# Patient Record
Sex: Female | Born: 1950 | Race: White | Hispanic: No | Marital: Single | Smoking: Never smoker
Health system: Southern US, Community
[De-identification: ages and names within clinical notes are randomized; demographics above are authoritative.]

## PROBLEM LIST (undated history)

## (undated) DIAGNOSIS — Z780 Asymptomatic menopausal state: Secondary | ICD-10-CM

## (undated) DIAGNOSIS — R7303 Prediabetes: Principal | ICD-10-CM

## (undated) DIAGNOSIS — Z1231 Encounter for screening mammogram for malignant neoplasm of breast: Secondary | ICD-10-CM

## (undated) DIAGNOSIS — I10 Essential (primary) hypertension: Secondary | ICD-10-CM

## (undated) DIAGNOSIS — H6521 Chronic serous otitis media, right ear: Principal | ICD-10-CM

## (undated) DIAGNOSIS — Z0000 Encounter for general adult medical examination without abnormal findings: Secondary | ICD-10-CM

## (undated) DIAGNOSIS — M25562 Pain in left knee: Secondary | ICD-10-CM

## (undated) DIAGNOSIS — 1 ERRONEOUS ENCOUNTER ICD10: Secondary | ICD-10-CM

## (undated) DIAGNOSIS — G8929 Other chronic pain: Secondary | ICD-10-CM

## (undated) DIAGNOSIS — M7989 Other specified soft tissue disorders: Secondary | ICD-10-CM

## (undated) DIAGNOSIS — K8020 Calculus of gallbladder without cholecystitis without obstruction: Secondary | ICD-10-CM

## (undated) DIAGNOSIS — Z860100 Personal history of colon polyps, unspecified: Principal | ICD-10-CM

## (undated) DIAGNOSIS — Z1211 Encounter for screening for malignant neoplasm of colon: Secondary | ICD-10-CM

## (undated) DIAGNOSIS — M8580 Other specified disorders of bone density and structure, unspecified site: Secondary | ICD-10-CM

## (undated) DIAGNOSIS — U071 COVID-19: Secondary | ICD-10-CM

## (undated) DIAGNOSIS — R42 Dizziness and giddiness: Principal | ICD-10-CM

## (undated) DIAGNOSIS — K8010 Calculus of gallbladder with chronic cholecystitis without obstruction: Secondary | ICD-10-CM

## (undated) DIAGNOSIS — M810 Age-related osteoporosis without current pathological fracture: Secondary | ICD-10-CM

## (undated) DIAGNOSIS — I4510 Unspecified right bundle-branch block: Secondary | ICD-10-CM

## (undated) HISTORY — PX: OTHER SURGICAL HISTORY: SHX169

---

## 1997-11-21 ENCOUNTER — Other Ambulatory Visit: Admission: RE | Admit: 1997-11-21 | Discharge: 1997-11-21 | Payer: Self-pay | Admitting: Obstetrics and Gynecology

## 1998-12-02 ENCOUNTER — Other Ambulatory Visit: Admission: RE | Admit: 1998-12-02 | Discharge: 1998-12-02 | Payer: Self-pay | Admitting: Obstetrics and Gynecology

## 1999-04-08 ENCOUNTER — Other Ambulatory Visit: Admission: RE | Admit: 1999-04-08 | Discharge: 1999-04-08 | Payer: Self-pay | Admitting: Obstetrics and Gynecology

## 1999-05-14 ENCOUNTER — Encounter (INDEPENDENT_AMBULATORY_CARE_PROVIDER_SITE_OTHER): Payer: Self-pay

## 1999-05-14 ENCOUNTER — Other Ambulatory Visit: Admission: RE | Admit: 1999-05-14 | Discharge: 1999-05-14 | Payer: Self-pay | Admitting: Obstetrics and Gynecology

## 1999-06-09 ENCOUNTER — Other Ambulatory Visit: Admission: RE | Admit: 1999-06-09 | Discharge: 1999-06-09 | Payer: Self-pay | Admitting: Obstetrics and Gynecology

## 1999-06-09 ENCOUNTER — Encounter (INDEPENDENT_AMBULATORY_CARE_PROVIDER_SITE_OTHER): Payer: Self-pay | Admitting: Specialist

## 1999-09-28 ENCOUNTER — Other Ambulatory Visit: Admission: RE | Admit: 1999-09-28 | Discharge: 1999-09-28 | Payer: Self-pay | Admitting: Obstetrics and Gynecology

## 2002-01-15 ENCOUNTER — Other Ambulatory Visit: Admission: RE | Admit: 2002-01-15 | Discharge: 2002-01-15 | Payer: Self-pay | Admitting: Obstetrics and Gynecology

## 2003-02-25 ENCOUNTER — Other Ambulatory Visit: Admission: RE | Admit: 2003-02-25 | Discharge: 2003-02-25 | Payer: Self-pay | Admitting: Obstetrics and Gynecology

## 2004-03-03 ENCOUNTER — Other Ambulatory Visit: Admission: RE | Admit: 2004-03-03 | Discharge: 2004-03-03 | Payer: Self-pay | Admitting: Obstetrics and Gynecology

## 2004-07-08 ENCOUNTER — Ambulatory Visit: Payer: Self-pay | Admitting: Internal Medicine

## 2004-08-07 ENCOUNTER — Ambulatory Visit: Payer: Self-pay | Admitting: Family Medicine

## 2004-10-07 ENCOUNTER — Ambulatory Visit: Payer: Self-pay | Admitting: Family Medicine

## 2004-12-15 ENCOUNTER — Ambulatory Visit: Payer: Self-pay | Admitting: Family Medicine

## 2004-12-30 ENCOUNTER — Ambulatory Visit: Payer: Self-pay | Admitting: Family Medicine

## 2005-03-04 ENCOUNTER — Ambulatory Visit: Payer: Self-pay | Admitting: Family Medicine

## 2005-03-16 ENCOUNTER — Other Ambulatory Visit: Admission: RE | Admit: 2005-03-16 | Discharge: 2005-03-16 | Payer: Self-pay | Admitting: Obstetrics and Gynecology

## 2005-04-06 ENCOUNTER — Ambulatory Visit: Payer: Self-pay | Admitting: Family Medicine

## 2005-04-30 ENCOUNTER — Ambulatory Visit: Payer: Self-pay | Admitting: Family Medicine

## 2005-05-24 ENCOUNTER — Ambulatory Visit: Payer: Self-pay | Admitting: Family Medicine

## 2005-07-09 ENCOUNTER — Ambulatory Visit: Payer: Self-pay | Admitting: Family Medicine

## 2005-07-09 ENCOUNTER — Encounter: Admission: RE | Admit: 2005-07-09 | Discharge: 2005-07-09 | Payer: Self-pay | Admitting: Family Medicine

## 2005-07-19 ENCOUNTER — Ambulatory Visit: Payer: Self-pay | Admitting: Family Medicine

## 2005-10-29 ENCOUNTER — Ambulatory Visit: Payer: Self-pay | Admitting: Family Medicine

## 2005-12-13 ENCOUNTER — Ambulatory Visit: Payer: Self-pay | Admitting: Family Medicine

## 2006-02-17 ENCOUNTER — Ambulatory Visit: Payer: Self-pay | Admitting: Family Medicine

## 2006-05-09 ENCOUNTER — Ambulatory Visit: Payer: Self-pay | Admitting: Family Medicine

## 2006-05-13 ENCOUNTER — Ambulatory Visit: Payer: Self-pay | Admitting: Family Medicine

## 2006-05-13 LAB — CONVERTED CEMR LAB
AST: 28 units/L (ref 0–37)
BUN: 16 mg/dL (ref 6–23)
CO2: 28 meq/L (ref 19–32)
Calcium: 9.7 mg/dL (ref 8.4–10.5)
Chloride: 106 meq/L (ref 96–112)
Creatinine, Ser: 0.8 mg/dL (ref 0.4–1.2)
GFR calc non Af Amer: 79 mL/min
Sodium: 141 meq/L (ref 135–145)
Total Bilirubin: 0.6 mg/dL (ref 0.3–1.2)

## 2006-06-13 ENCOUNTER — Ambulatory Visit: Payer: Self-pay | Admitting: Family Medicine

## 2006-07-08 ENCOUNTER — Ambulatory Visit: Payer: Self-pay | Admitting: Internal Medicine

## 2006-09-12 ENCOUNTER — Ambulatory Visit: Payer: Self-pay | Admitting: Family Medicine

## 2006-09-12 LAB — CONVERTED CEMR LAB
Albumin: 3.4 g/dL — ABNORMAL LOW (ref 3.5–5.2)
CO2: 33 meq/L — ABNORMAL HIGH (ref 19–32)
Chloride: 104 meq/L (ref 96–112)
Creatinine, Ser: 0.6 mg/dL (ref 0.4–1.2)
GFR calc non Af Amer: 111 mL/min
Glucose, Bld: 121 mg/dL — ABNORMAL HIGH (ref 70–99)

## 2006-10-17 ENCOUNTER — Ambulatory Visit: Payer: Self-pay | Admitting: Internal Medicine

## 2006-11-03 ENCOUNTER — Emergency Department (HOSPITAL_COMMUNITY): Admission: EM | Admit: 2006-11-03 | Discharge: 2006-11-03 | Payer: Self-pay | Admitting: Emergency Medicine

## 2007-10-23 ENCOUNTER — Ambulatory Visit: Payer: Self-pay | Admitting: Internal Medicine

## 2007-11-06 ENCOUNTER — Ambulatory Visit: Payer: Self-pay | Admitting: Internal Medicine

## 2007-12-18 ENCOUNTER — Telehealth: Payer: Self-pay | Admitting: Internal Medicine

## 2008-01-09 DIAGNOSIS — K5730 Diverticulosis of large intestine without perforation or abscess without bleeding: Secondary | ICD-10-CM | POA: Insufficient documentation

## 2008-01-09 DIAGNOSIS — I10 Essential (primary) hypertension: Secondary | ICD-10-CM | POA: Insufficient documentation

## 2008-01-09 DIAGNOSIS — E785 Hyperlipidemia, unspecified: Secondary | ICD-10-CM

## 2008-01-09 DIAGNOSIS — Z8669 Personal history of other diseases of the nervous system and sense organs: Secondary | ICD-10-CM

## 2008-01-10 ENCOUNTER — Ambulatory Visit: Payer: Self-pay | Admitting: Internal Medicine

## 2008-01-10 DIAGNOSIS — K5732 Diverticulitis of large intestine without perforation or abscess without bleeding: Secondary | ICD-10-CM | POA: Insufficient documentation

## 2012-07-31 ENCOUNTER — Other Ambulatory Visit: Payer: Self-pay | Admitting: Obstetrics and Gynecology

## 2012-08-02 ENCOUNTER — Encounter (HOSPITAL_COMMUNITY): Payer: Self-pay

## 2012-08-02 ENCOUNTER — Encounter (HOSPITAL_COMMUNITY)
Admission: RE | Admit: 2012-08-02 | Discharge: 2012-08-02 | Disposition: A | Discharge status: Home / Self Care | Payer: 59 | Source: Ambulatory Visit | Attending: Obstetrics and Gynecology | Admitting: Obstetrics and Gynecology

## 2012-08-02 HISTORY — DX: Essential (primary) hypertension: I10

## 2012-08-02 LAB — BASIC METABOLIC PANEL
BUN: 22 mg/dL (ref 6–23)
CO2: 30 mEq/L (ref 19–32)
Chloride: 101 mEq/L (ref 96–112)
Sodium: 139 mEq/L (ref 135–145)

## 2012-08-02 LAB — CBC
HCT: 44.5 % (ref 36.0–46.0)
Hemoglobin: 15.1 g/dL — ABNORMAL HIGH (ref 12.0–15.0)
MCH: 30.8 pg (ref 26.0–34.0)
MCHC: 33.9 g/dL (ref 30.0–36.0)
MCV: 90.6 fL (ref 78.0–100.0)
Platelets: 237 10*3/uL (ref 150–400)
RBC: 4.91 MIL/uL (ref 3.87–5.11)

## 2012-08-02 NOTE — Patient Instructions (Addendum)
20 Laurie Cross  08/02/2012   Your procedure is scheduled on:  08/10/12  Enter through the Main Entrance of [facility] at 6 AM.  Pick up the phone at the desk and dial [phone].   Call this number if you have problems the morning of surgery: [phone]   Remember:   Do not eat food:After Midnight.  Do not drink clear liquids: After Midnight.  Take these medicines the morning of surgery with A SIP OF WATER: blood pressure and Citalopram    Do not wear jewelry, make-up or nail polish.  Do not wear lotions, powders, or perfumes. You may wear deodorant.  Do not shave 48 hours prior to surgery.  Do not bring valuables to the hospital.  Contacts, dentures or bridgework may not be worn into surgery.  Leave suitcase in the car. After surgery it may be brought to your room.  For patients admitted to the hospital, checkout time is 11:00 AM the day of discharge.   Patients discharged the day of surgery will not be allowed to drive home.  Name and phone number of your driver: undecided  Special Instructions: Shower using CHG 2 nights before surgery and the night before surgery.  If you shower the day of surgery use CHG.  Use special wash - you have one bottle of CHG for all showers.  You should use approximately 1/3 of the bottle for each shower.   Please read over the following fact sheets that you were given: Surgical Site Infection Prevention

## 2012-08-10 ENCOUNTER — Ambulatory Visit (HOSPITAL_COMMUNITY): Payer: 59 | Admitting: Anesthesiology

## 2012-08-10 ENCOUNTER — Encounter (HOSPITAL_COMMUNITY)
Admission: RE | Disposition: A | Discharge status: Home / Self Care | Payer: Self-pay | Source: Ambulatory Visit | Attending: Obstetrics and Gynecology

## 2012-08-10 ENCOUNTER — Encounter (HOSPITAL_COMMUNITY): Payer: Self-pay | Admitting: Anesthesiology

## 2012-08-10 ENCOUNTER — Encounter (HOSPITAL_COMMUNITY): Payer: Self-pay

## 2012-08-10 ENCOUNTER — Ambulatory Visit (HOSPITAL_COMMUNITY)
Admission: RE | Admit: 2012-08-10 | Discharge: 2012-08-10 | Disposition: A | Discharge status: Home / Self Care | Payer: 59 | Source: Ambulatory Visit | Attending: Obstetrics and Gynecology | Admitting: Obstetrics and Gynecology

## 2012-08-10 DIAGNOSIS — N950 Postmenopausal bleeding: Secondary | ICD-10-CM | POA: Insufficient documentation

## 2012-08-10 DIAGNOSIS — N840 Polyp of corpus uteri: Secondary | ICD-10-CM | POA: Insufficient documentation

## 2012-08-10 DIAGNOSIS — Z01818 Encounter for other preprocedural examination: Secondary | ICD-10-CM | POA: Insufficient documentation

## 2012-08-10 DIAGNOSIS — Z01812 Encounter for preprocedural laboratory examination: Secondary | ICD-10-CM | POA: Insufficient documentation

## 2012-08-10 HISTORY — DX: Unspecified right bundle-branch block: I45.10

## 2012-08-10 HISTORY — PX: HYSTEROSCOPY W/D&C: SHX1775

## 2012-08-10 SURGERY — DILATATION AND CURETTAGE /HYSTEROSCOPY
Anesthesia: General | Site: Uterus | Wound class: Clean Contaminated

## 2012-08-10 MED ORDER — LIDOCAINE HCL (CARDIAC) 20 MG/ML IV SOLN
INTRAVENOUS | Status: DC | PRN
  Administered 2012-08-10 (×2): 30 mg via INTRAVENOUS

## 2012-08-10 MED ORDER — LIDOCAINE HCL 1 % IJ SOLN
INTRAMUSCULAR | Status: DC | PRN
  Administered 2012-08-10: 20 mL

## 2012-08-10 MED ORDER — PROMETHAZINE HCL 25 MG/ML IJ SOLN: 6.2500 mg | INTRAMUSCULAR | Status: DC | PRN

## 2012-08-10 MED ORDER — ONDANSETRON HCL 4 MG/2ML IJ SOLN
INTRAMUSCULAR | Status: DC | PRN
  Administered 2012-08-10: 4 mg via INTRAVENOUS

## 2012-08-10 MED ORDER — PROPOFOL 10 MG/ML IV EMUL
INTRAVENOUS | Status: AC
  Filled 2012-08-10: qty 20

## 2012-08-10 MED ORDER — GLYCINE 1.5 % IR SOLN
Status: DC | PRN
  Administered 2012-08-10: 3000 mL

## 2012-08-10 MED ORDER — FENTANYL CITRATE 0.05 MG/ML IJ SOLN
INTRAMUSCULAR | Status: AC
  Filled 2012-08-10: qty 5

## 2012-08-10 MED ORDER — FENTANYL CITRATE 0.05 MG/ML IJ SOLN: 25.0000 ug | INTRAMUSCULAR | Status: DC | PRN

## 2012-08-10 MED ORDER — PROPOFOL 10 MG/ML IV EMUL
INTRAVENOUS | Status: DC | PRN
  Administered 2012-08-10: 170 mg via INTRAVENOUS

## 2012-08-10 MED ORDER — FENTANYL CITRATE 0.05 MG/ML IJ SOLN
INTRAMUSCULAR | Status: DC | PRN
  Administered 2012-08-10 (×2): 50 ug via INTRAVENOUS

## 2012-08-10 MED ORDER — EPHEDRINE 5 MG/ML INJ
INTRAVENOUS | Status: AC
  Filled 2012-08-10: qty 10

## 2012-08-10 MED ORDER — CEFAZOLIN SODIUM-DEXTROSE 2-3 GM-% IV SOLR
INTRAVENOUS | Status: AC
  Filled 2012-08-10: qty 50

## 2012-08-10 MED ORDER — EPHEDRINE SULFATE 50 MG/ML IJ SOLN
INTRAMUSCULAR | Status: DC | PRN
  Administered 2012-08-10: 20 mg via INTRAVENOUS

## 2012-08-10 MED ORDER — MIDAZOLAM HCL 2 MG/2ML IJ SOLN
INTRAMUSCULAR | Status: AC
  Filled 2012-08-10: qty 2

## 2012-08-10 MED ORDER — KETOROLAC TROMETHAMINE 30 MG/ML IJ SOLN
INTRAMUSCULAR | Status: DC | PRN
  Administered 2012-08-10: 30 mg via INTRAVENOUS

## 2012-08-10 MED ORDER — KETOROLAC TROMETHAMINE 30 MG/ML IJ SOLN
INTRAMUSCULAR | Status: AC
  Filled 2012-08-10: qty 1

## 2012-08-10 MED ORDER — CEFAZOLIN SODIUM-DEXTROSE 2-3 GM-% IV SOLR
2.0000 g | INTRAVENOUS | Status: AC
  Administered 2012-08-10: 2 g via INTRAVENOUS

## 2012-08-10 MED ORDER — MEPERIDINE HCL 25 MG/ML IJ SOLN: 6.2500 mg | INTRAMUSCULAR | Status: DC | PRN

## 2012-08-10 MED ORDER — MIDAZOLAM HCL 5 MG/5ML IJ SOLN
INTRAMUSCULAR | Status: DC | PRN
  Administered 2012-08-10: 2 mg via INTRAVENOUS

## 2012-08-10 MED ORDER — LIDOCAINE HCL 0.5 % IJ SOLN
INTRAMUSCULAR | Status: AC
  Filled 2012-08-10: qty 1

## 2012-08-10 MED ORDER — ONDANSETRON HCL 4 MG/2ML IJ SOLN
INTRAMUSCULAR | Status: AC
  Filled 2012-08-10: qty 2

## 2012-08-10 MED ORDER — LIDOCAINE HCL (CARDIAC) 20 MG/ML IV SOLN
INTRAVENOUS | Status: AC
  Filled 2012-08-10: qty 5

## 2012-08-10 MED ORDER — FENTANYL CITRATE 0.05 MG/ML IJ SOLN
INTRAMUSCULAR | Status: AC
  Filled 2012-08-10: qty 2

## 2012-08-10 MED ORDER — MIDAZOLAM HCL 2 MG/2ML IJ SOLN: 0.5000 mg | Freq: Once | INTRAMUSCULAR | Status: DC | PRN

## 2012-08-10 MED ORDER — LACTATED RINGERS IV SOLN
INTRAVENOUS | Status: DC
  Administered 2012-08-10: 50 mL/h via INTRAVENOUS

## 2012-08-10 MED ORDER — KETOROLAC TROMETHAMINE 30 MG/ML IJ SOLN: 15.0000 mg | Freq: Once | INTRAMUSCULAR | Status: DC | PRN

## 2012-08-10 SURGICAL SUPPLY — 15 items
CANISTER SUCTION 2500CC (MISCELLANEOUS) ×2 IMPLANT
CATH ROBINSON RED A/P 16FR (CATHETERS) ×2 IMPLANT
CLOTH BEACON ORANGE TIMEOUT ST (SAFETY) ×2 IMPLANT
CONTAINER PREFILL 10% NBF 60ML (FORM) ×4 IMPLANT
DRESSING TELFA 8X3 (GAUZE/BANDAGES/DRESSINGS) ×2 IMPLANT
ELECT REM PT RETURN 9FT ADLT (ELECTROSURGICAL) ×2
ELECTRODE REM PT RTRN 9FT ADLT (ELECTROSURGICAL) ×1 IMPLANT
GLOVE ECLIPSE 7.0 STRL STRAW (GLOVE) ×4 IMPLANT
GOWN PREVENTION PLUS XLARGE (GOWN DISPOSABLE) ×2 IMPLANT
GOWN STRL REIN XL XLG (GOWN DISPOSABLE) ×4 IMPLANT
LOOP ANGLED CUTTING 22FR (CUTTING LOOP) IMPLANT
PACK HYSTEROSCOPY LF (CUSTOM PROCEDURE TRAY) ×2 IMPLANT
PAD OB MATERNITY 4.3X12.25 (PERSONAL CARE ITEMS) ×2 IMPLANT
TOWEL OR 17X24 6PK STRL BLUE (TOWEL DISPOSABLE) ×4 IMPLANT
WATER STERILE IRR 1000ML POUR (IV SOLUTION) ×2 IMPLANT

## 2012-08-10 NOTE — Transfer of Care (Signed)
Immediate Anesthesia Transfer of Care Note  Patient: Laurie Cross  Procedure(s) Performed: Procedure(s) (LRB) with comments: DILATATION AND CURETTAGE /HYSTEROSCOPY (N/A)  Patient Location: PACU  Anesthesia Type:General  Level of Consciousness: awake, alert  and oriented  Airway & Oxygen Therapy: Patient Spontanous Breathing and Patient connected to nasal cannula oxygen  Post-op Assessment: Report given to PACU RN and Post -op Vital signs reviewed and stable  Post vital signs: Reviewed and stable  Complications: No apparent anesthesia complications

## 2012-08-10 NOTE — H&P (Signed)
Pt is a 62 year old white female who presents to the OR for a D&C secondary to postmenopausal bleeding. Pt had a normal endometrial biopsy, but her endometrial strip appears thickened. PE: overwt white female in NAD        HEENT- wnl        Abd-soft, non tender, no masses.        Pelvic-wnl IMP/ Post menopausal bleeding Plan/ Hysteroscopy, D&C

## 2012-08-10 NOTE — Anesthesia Preprocedure Evaluation (Addendum)
Anesthesia Evaluation  Patient identified by MRN, date of birth, ID band Patient awake    Reviewed: Allergy & Precautions, H&P , Patient's Chart, lab work & pertinent test results, reviewed documented beta blocker date and time   History of Anesthesia Complications Negative for: history of anesthetic complications  Airway Mallampati: III TM Distance: >3 FB Neck ROM: full  Mouth opening: Limited Mouth Opening  Dental No notable dental hx.    Pulmonary neg pulmonary ROS,  breath sounds clear to auscultation  Pulmonary exam normal       Cardiovascular Exercise Tolerance: Good hypertension, negative cardio ROS  + dysrhythmias Rhythm:regular Rate:Normal  RBBB   Neuro/Psych Depression negative neurological ROS     GI/Hepatic negative GI ROS, Neg liver ROS,   Endo/Other  negative endocrine ROSMorbid obesity  Renal/GU negative Renal ROS     Musculoskeletal   Abdominal   Peds  Hematology negative hematology ROS (+)   Anesthesia Other Findings RBBB  Reproductive/Obstetrics negative OB ROS                         Anesthesia Physical Anesthesia Plan  ASA: III  Anesthesia Plan: General LMA   Post-op Pain Management:    Induction:   Airway Management Planned:   Additional Equipment:   Intra-op Plan:   Post-operative Plan:   Informed Consent: I have reviewed the patients History and Physical, chart, labs and discussed the procedure including the risks, benefits and alternatives for the proposed anesthesia with the patient or authorized representative who has indicated his/her understanding and acceptance.   Dental Advisory Given  Plan Discussed with: CRNA, Surgeon and Anesthesiologist  Anesthesia Plan Comments:         Anesthesia Quick Evaluation

## 2012-08-10 NOTE — Anesthesia Postprocedure Evaluation (Signed)
  Anesthesia Post-op Note  Patient: Laurie Cross  Procedure(s) Performed: Procedure(s) (LRB) with comments: DILATATION AND CURETTAGE /HYSTEROSCOPY (N/A)  Patient Location: PACU  Anesthesia Type:General  Level of Consciousness: awake, alert  and oriented  Airway and Oxygen Therapy: Patient Spontanous Breathing  Post-op Pain: none  Post-op Assessment: Post-op Vital signs reviewed, Patient's Cardiovascular Status Stable, Respiratory Function Stable, Patent Airway, No signs of Nausea or vomiting and Pain level controlled  Post-op Vital Signs: Reviewed and stable  Complications: No apparent anesthesia complications

## 2012-08-10 NOTE — Op Note (Signed)
NAME:  NAVI, EWTON NO.:  [id number]  MEDICAL RECORD NO.:  [id number]  LOCATION:  [facility]                          FACILITY:  [facility]  PHYSICIAN:  Malva Limes, M.D.    DATE OF BIRTH:  1951-05-05  DATE OF PROCEDURE:  08/10/2012 DATE OF DISCHARGE:                              OPERATIVE REPORT   PREOPERATIVE DIAGNOSIS:  Postmenopausal bleeding.  POSTOPERATIVE DIAGNOSES: 1. Postmenopausal bleeding. 2. Endometrial polyp.  PROCEDURES: 1. Hysteroscopy. 2. Resection of endometrial polyp. 3. Dilation and curettage.  SURGEON:  Malva Limes, MD  ANESTHESIA:  General with local.  ANTIBIOTICS:  Ancef 2 g.  DRAINS:  Red rubber catheter bladder.  SPECIMENS:  Endometrial polyp and endometrial curettings sent to Pathology.  ESTIMATED BLOOD LOSS:  Minimal.  COMPLICATIONS:  None.  PROCEDURE:  The patient was taken to the operating room.  She was placed in a dorsal supine position and general anesthetic was administered without difficulty.  She was then placed in dorsal supine position.  She was prepped and draped in the usual fashion for this procedure.  Her bladder was drained with a red rubber catheter.  An exam under anesthesia revealed anteverted uterus of normal size and shape.  There are no adnexal masses.  A sterile speculum was placed in the vagina.  A 20 mL of 1% lidocaine was used for paracervical block.  A single-tooth tenaculum was applied to the anterior cervical lip.  The cervix was serially dilated to a 21-French.  The hysteroscope was advanced through the endocervical canal which appeared to be normal.  On entering the uterine cavity, both ostia were visualized and felt to be normal. Majority of the endometrial lining was atrophic.  There was an endometrial polyp which looked infarcted, arising from the fundus of the uterus on a long stalk.  At this point, the stalk was resected from the base at the fundus and removed, this was sent to pathology.  A  sharp curettage was then performed on the endometrial cavity and endocervical cavity.  Minimal tissue was obtained.  The uterus had been sounded to 7 cm.  This concluded the procedure.  The patient was taken to the recovery room in stable condition.  Instrument and lap counts correct x2.          ______________________________ Malva Limes, M.D.     MA/MEDQ  D:  08/10/2012  T:  08/10/2012  Job:  [id number]

## 2012-08-11 ENCOUNTER — Encounter (HOSPITAL_COMMUNITY): Payer: Self-pay | Admitting: Obstetrics and Gynecology

## 2012-08-19 ENCOUNTER — Other Ambulatory Visit: Payer: Self-pay

## 2013-05-10 ENCOUNTER — Other Ambulatory Visit: Payer: Self-pay

## 2013-05-22 ENCOUNTER — Other Ambulatory Visit: Payer: Self-pay | Admitting: Obstetrics and Gynecology

## 2014-04-19 ENCOUNTER — Other Ambulatory Visit: Payer: Self-pay

## 2014-05-23 ENCOUNTER — Other Ambulatory Visit: Payer: Self-pay | Admitting: Obstetrics and Gynecology

## 2014-05-27 LAB — CYTOLOGY - PAP

## 2014-11-29 LAB — AMB EXT HEP C ANTIBODY
HEP C ANTIBODY, EXTERNAL: NONREACTIVE
Hep C Antibody, External: NONREACTIVE

## 2015-05-27 ENCOUNTER — Other Ambulatory Visit: Payer: Self-pay | Admitting: Obstetrics and Gynecology

## 2015-05-28 ENCOUNTER — Encounter: Payer: Self-pay | Admitting: Internal Medicine

## 2015-05-30 LAB — CYTOLOGY - PAP

## 2016-06-03 ENCOUNTER — Other Ambulatory Visit: Payer: Self-pay | Admitting: Obstetrics and Gynecology

## 2016-06-04 LAB — CYTOLOGY - PAP

## 2017-05-04 ENCOUNTER — Encounter

## 2017-06-07 ENCOUNTER — Encounter

## 2017-06-07 ENCOUNTER — Inpatient Hospital Stay: Admit: 2017-06-07 | Payer: MEDICARE | Attending: Speech-Language Pathologist | Primary: Family Medicine

## 2017-06-07 DIAGNOSIS — Z1231 Encounter for screening mammogram for malignant neoplasm of breast: Secondary | ICD-10-CM

## 2017-06-07 DIAGNOSIS — M8580 Other specified disorders of bone density and structure, unspecified site: Secondary | ICD-10-CM

## 2017-11-09 ENCOUNTER — Encounter: Payer: Self-pay | Admitting: Internal Medicine

## 2018-04-05 ENCOUNTER — Encounter

## 2018-06-09 ENCOUNTER — Inpatient Hospital Stay: Admit: 2018-06-09 | Payer: MEDICARE | Attending: Speech-Language Pathologist | Primary: Family Medicine

## 2018-06-09 DIAGNOSIS — Z1231 Encounter for screening mammogram for malignant neoplasm of breast: Secondary | ICD-10-CM

## 2019-01-22 ENCOUNTER — Encounter

## 2019-03-07 LAB — HM COLONOSCOPY

## 2019-06-06 ENCOUNTER — Encounter

## 2019-06-12 ENCOUNTER — Inpatient Hospital Stay: Admit: 2019-06-12 | Payer: MEDICARE | Attending: Obstetrics | Primary: Family Medicine

## 2019-06-12 DIAGNOSIS — M810 Age-related osteoporosis without current pathological fracture: Secondary | ICD-10-CM

## 2019-06-12 DIAGNOSIS — Z1231 Encounter for screening mammogram for malignant neoplasm of breast: Secondary | ICD-10-CM

## 2019-07-13 LAB — CMP, EXTERNAL
ALBUMIN, EXTERNAL: 4.3
ALBUMIN, EXTERNAL: 4.3 NA
ALK PHOS, EXTERNAL: 49
ALK PHOS, EXTERNAL: 49 NA
BILI TOTAL, EXTERNAL: 1
BUN, EXTERNAL: 14
BUN, EXTERNAL: 14 NA
CO2, EXTERNAL: 27
CO2, External: 27 NA
CREATININE SER, EXTERNAL: 0.85
CREATININE SER, EXTERNAL: 0.85 NA
Calcium, EXTERNAL: 8.9
Calcium, EXTERNAL: 8.9 NA
Chloride, EXTERNAL: 102
Chloride, EXTERNAL: 102 NA
Glucose SER, EXTERNAL: 104
Glucose, EXTERNAL: 104 NA
Potassium, EXTERNAL: 3.4
Potassium, External: 3.4 NA
Protein TOT, EXTERNAL: 7.2
Protein, Total, EXTERNAL: 7.2 NA
SGOT (AST), EXTERNAL: 20
SGOT (AST), EXTERNAL: 20 NA
SGPT (ALT), EXTERNAL: 25
SGPT (ALT), EXTERNAL: 25 NA
Sodium, EXTERNAL: 140
Sodium, External: 140 NA
Total Bilirubin, EXTERNAL: 1 NA

## 2019-07-13 LAB — LIPID PANEL
HDL, EXTERNAL: 49 NA
HDL, External: 49
LDL-C, External: 135
LDL-C, External: 135 NA
TOTAL CHOLESTEROL, EXTERNAL: 221 NA
TRIGLYCERIDES, EXTERNAL: 184 NA
Total Cholesterol, External: 221
Triglycerides, External: 184

## 2019-11-08 NOTE — Telephone Encounter (Signed)
Future Appointments   Date Time Provider Department Center   02/01/2020  1:45 PM Amparo Bristol, MD [facility]           Packet Mailed

## 2019-11-12 NOTE — Telephone Encounter (Signed)
I will do a separate phone note once chart has been abstracted.

## 2020-01-23 ENCOUNTER — Encounter

## 2020-02-01 ENCOUNTER — Ambulatory Visit: Attending: Family Medicine | Primary: Family Medicine

## 2020-02-01 ENCOUNTER — Ambulatory Visit: Admit: 2020-02-01 | Discharge: 2020-02-01 | Payer: MEDICARE | Attending: Family Medicine | Primary: Family Medicine

## 2020-02-01 DIAGNOSIS — Z0000 Encounter for general adult medical examination without abnormal findings: Secondary | ICD-10-CM

## 2020-02-01 MED ORDER — HYDROCHLOROTHIAZIDE 25 MG TAB
25 mg | ORAL_TABLET | Freq: Every day | ORAL | 3 refills | Status: DC
Start: 2020-02-01 — End: 2020-12-25

## 2020-02-01 NOTE — Progress Notes (Signed)
[facility]   [address] [place] [address]  [phone]    SUBSEQUENT MEDICARE [place] VISIT       CHIEF COMPLAINT     LEVAEH VICE, 69 y.o. female, presents for Annual Wellness Visit and Establish Care.   New pt to myself and [facility] primary care.    HPI     1.  Preventive  Colonoscopy in 2020, normal - 5 year recall  Sees GYN for paps/mams and UTD on them  Recent DEXA with osteopenia  Hx of melanoma, sees Derm regularly    ROS: No TIA's or dysphagia. No prolonged cough. No dyspnea or chest pain on exertion.  No abdominal pain, change in bowel habits, black or bloody stools.  No urinary tract symptoms. She is post menopausal. No hot flashes, abnormal vaginal bleeding, discharge or unexpected pelvic pain. No new breast lumps, breast pain or nipple discharge.      OBJECTIVE     Visit Vitals  BP 138/86 (BP 1 Location: Left arm, BP Patient Position: Sitting, BP Cuff Size: Adult)   Pulse 67   Ht 5' 5.5" (1.664 m)   Wt 185 lb 14.4 oz (84.3 kg)   BMI 30.46 kg/m??   I have reviewed/discussed the above normal BMI with the patient.  I have recommended the following interventions: dietary management education, guidance, and counseling, encourage exercise and monitor weight . Marland Kitchen         BP Readings from Last 3 Encounters:   02/01/20 138/86      Wt Readings from Last 3 Encounters:   02/01/20 185 lb 14.4 oz (84.3 kg)     Physical Exam  Constitutional:       Appearance: Normal appearance. She is obese.   HENT:      Head: Normocephalic and atraumatic.      Right Ear: Tympanic membrane normal.      Left Ear: Tympanic membrane normal.   Eyes:      General:         Right eye: No discharge.         Left eye: No discharge.      Extraocular Movements: Extraocular movements intact.      Conjunctiva/sclera: Conjunctivae normal.      Pupils: Pupils are equal, round, and reactive to light.   Cardiovascular:      Rate and Rhythm: Normal rate and regular rhythm.      Pulses: Normal pulses.       Heart sounds: Normal heart sounds.   Pulmonary:      Effort: Pulmonary effort is normal.      Breath sounds: Normal breath sounds.   Abdominal:      General: Bowel sounds are normal.      Palpations: Abdomen is soft.   Musculoskeletal:      Cervical back: Normal range of motion and neck supple.      Right lower leg: No edema.      Left lower leg: No edema.   Skin:     General: Skin is warm and dry.      Capillary Refill: Capillary refill takes less than 2 seconds.   Neurological:      General: No focal deficit present.      Mental Status: She is alert and oriented to person, place, and time.   Psychiatric:         Mood and Affect: Mood normal.  Behavior: Behavior normal.           ASSESSMENT AND PLAN     Diagnoses and all orders for this visit:    1. Medicare annual wellness visit, subsequent    2. Essential hypertension  -     hydroCHLOROthiazide (HYDRODIURIL) 25 mg tablet; Take 1 Tablet by mouth daily.    3. Preventative health care    1.  Preventive  UTD on recommended cancer screenings and imms.  Lifestyle advice given.  Keep f/u with specialist providers.      WELLNESS EVALUATION & HEALTH RISK ASSESSMENT     DEPRESSION SCREENING  3 most recent [place] Screens 02/01/2020   Little interest or pleasure in doing things Not at all   Feeling down, depressed, irritable, or hopeless Not at all   Total Score PHQ 2 0          FALL RISK SCREENING  Fall Risk Assessment, last 12 mths 02/01/2020   Able to walk? Yes   Fall in past 12 months? 1   Do you feel unsteady? 0   Are you worried about falling 0   Is TUG test greater than 12 seconds? 0   Is the gait abnormal? 0   Number of falls in past 12 months 1   Fall with injury? 1       GENERAL  In general, how would you say your health is?: Excellent  Do you have a Living Will?: Yes    HEALTH HABITS AND NUTRITION  Do you worry whether your food will run out before you have money to buy more?: No  Have you lost any weight without trying in the past 3 months?:  No    HEARING/VISION/SKIN  Do you or your family notice any trouble with your hearing?: No  Do you have difficulty driving, watching TV, or doing any of your daily activities because of your eyesight?: No  Do you have any skin concerns?: No    SLEEP/MEMORY/ANXIETY  Do you have concerns about your sleep?: No  Do you often feel tired, fatigued, or sleepy during the daytime, even after a "good" night's sleep? : No  Do you or a family member have concerns about your memory?: No  Over the last several months, have you been continually worried or anxious about a number of events or activities in your daily life? : No    SUBSTANCE AND OPIOID USE  Do you currently drink alcohol?: No  In the past year, have you used street drugs or prescription medications not prescribed to you?: No  Are you currently using a prescription opioid pain medication such as oxycodone, tramadol, hydrocodone or morphine?: No    SAFETY  Does your home have throw rugs, poor lighting, a slippery bathtub or shower, or clutter in the hallways?: No  Do all of your stairways have a railing or banister?: Yes  Do you have difficulty with balance or walking?: No    ADLS  In the past 7 days, did you need help from others to perform any of the following everyday activities?: None  In the past 7 days, did you need help from others to take care of any of the following?: None    PHYSICAL ACTIVITY  Are You Physically Active: Yes  Do You Have Difficulty Exercising: No  What Type of Activity Do You Engage In: Walking, Other, Mowing/yard work  How Many Days Are You Active Per Week : 5-7  How Many Average Minutes of Activity  Per Day: More than 60 Minutes     TIMED UP AND GO TEST (If indicated)       MINI-COG SCORE (If indicated)       STOP-BANG SCORE (If indicated)         PREVENTIVE CARE -SCREENINGS      Health Maintenance Topics with due status: Overdue       Topic Date Due    Shingrix Vaccine Age 37> Never done     Health Maintenance Topics with due status: Not Due        Topic Last Completion Date    DTaP/Tdap/Td series 05/11/2017    Colorectal Cancer Screening Combo 03/07/2019    Flu Vaccine 04/29/2019    Breast Cancer Screen Mammogram 06/12/2019    Lipid Screen 07/13/2019    Medicare Yearly Exam 02/01/2020     Health Maintenance Topics with due status: Completed       Topic Last Completion Date    Hepatitis C Screening 11/29/2014    Pneumococcal 65+ years 05/18/2018    Bone Densitometry (Dexa) Screening 06/12/2019    COVID-19 Vaccine 10/06/2019         Colon cancer:  Recommendation: Colonoscopy every 10y or annual FIT test from 50-75 or every 3 year stool DNA based test with consideration of ongoing screening from 76-85. and Up to date or Completed    Lung cancer (LDCT): Recommendation: Yearly LDCT for pts 55-77 w 30-pack year hx and currently smoke or quit <15 yr ago. and Not Indicated    Hepatitis C:  Recommendation: One time screening for all patient's aged 18-79. and Up to date or Completed    Diabetes:   Recommendation: USPSTF recommends screening ages 18-70 y/o if overweight or obese. Medicare covers screening in those patients who are overweight, obese, have HTN or dyslipiemia, a personal history of gestational diabetes or prior elevated blood sugar, a family hx of DM, or any patient over age 14 and Up to date or Completed    Lipids:   Recommendation: screening for hyperlipidemia every 5 years after age 60 and Up to date or Completed    PREVENTIVE CARE - FEMALE SCREENINGS     Cervical cancer: Recommendation: Every 3 yr from 21-29 and every 5 yr from 46-65, with Pap and HPV testing. and Up to date or Completed    Breast cancer: Recommendation:  USPSTF recommends screening mammography every 2 yr from 50-74. The decision to start screening mammography in women prior to age 92 years should be an individual one. Women who place a higher value on the potential benefit than the potential harms may choose to begin biennial screening between the ages of 69 and 51 years.  Medicare covers annual screening mammography, USPSTF also recommends women with a personal or family history of breast, ovarian, tubal, or peritoneal cancer or who have an ancestry (Ashkenazi Jewish) associated with breast cancer susceptibility 1 and 2 (BRCA1/2) gene mutations with an appropriate brief familial risk assessment tool. Women with a positive result on the risk assessment tool should receive genetic counseling and, if indicated after counseling, genetic testing and mammogram is up to date or completed    Osteoporosis: Recommendation: Screen all women at 9, earlier if elevated risk and Up to date or Completed    AAA:   Recommendation: One-time screening if family history of AAA and Not Indicated    IMMUNIZATIONS     Immunization History   Administered Date(s) Administered   ??? Covid-19, MODERNA, Mrna, Lnp-s, Pf, 125mg/0.5mL  09/08/2019, 10/06/2019   ??? Influenza Vaccine 04/28/2019   ??? Influenza, Quadrivalent, Adjuvanted (>65 Yrs FLUAD QUAD 42595) 04/29/2019   ??? Pneumococcal Polysaccharide (PPSV-23) 05/18/2018   ??? Tdap 05/11/2017       Pneumovax:   Recommendation: PPSV23 once for all >65 and high risk <65  and Up to date or Completed    Prevnar:   Recommendation: PCV13 only if >65 and immunocompromised or residing in a nursing home, or in areas of low childhood Pneumococcal vaccination and Not Indicated    Influenza:   Recommendation: Vaccination annually, high dose if 65 or older and Up to date or Completed    Shingrix:  Recommendation: Vaccination 2 shots 2-6 months apart for all age >29 and Up to date or Completed    TDaP:    Recommendation: Investment banker, operational with TDaP every 10 yr. and Up to date or Completed      INDIVIDUALIZED SCREENING, EDUCATION, AND PLAN     The patient and any caregiver(s) were counseled on: Healthcare maintenance and preventive items as above.    The patient is not on high risk medication(s) including benzodiaepines.    Based on my evaluation of the patient and the Health Risk  Assessment performed today, there is not evidence of cognitive impairment.      Medications, allergies, problem list, previous encounters, recent results, medical, social and family history reviewed in the electronic health record. Medications and problems are viewable in the Encounter tab for this visit.    Current Outpatient Medications   Medication Sig   ??? losartan (COZAAR) 25 mg tablet    ??? fluticasone propionate (Flonase Allergy Relief) 50 mcg/actuation nasal spray 1 Spray by Both Nostrils route.   ??? antiox #8/om3/dha/epa/lut/zeax (PRESERVISION AREDS 2, OMEGA-3, PO) Take  by mouth.   ??? red yeast rice extract 600 mg cap Take 1,200 mg by mouth daily.   ??? triamcinolone acetonide (KENALOG) 0.1 % topical cream Apply  to affected area as needed for Skin Irritation. use thin layer   ??? cholecalciferol (VITAMIN D3) (2,000 UNITS /50 MCG) cap capsule Take  by mouth daily.   ??? hydroCHLOROthiazide (HYDRODIURIL) 25 mg tablet Take 1 Tablet by mouth daily.     No current facility-administered medications for this visit.     Medications Discontinued During This Encounter   Medication Reason   ??? hydroCHLOROthiazide (HYDRODIURIL) 25 mg tablet REORDER     Allergies   Allergen Reactions   ??? Amoxicillin Unknown (comments)   ??? Clindamycin Unknown (comments)     Past Medical History:   Diagnosis Date   ??? Back pain    ??? Bilateral cataracts    ??? Contusion of left breast    ??? Diverticulitis    ??? Epiretinal membrane     Bilateral   ??? H/O seasonal allergies    ??? Hyperlipidemia    ??? Hypertension    ??? Hypertensive disorder    ??? Hypertensive retinopathy of both eyes    ??? Hypokalemia    ??? Kidney stone    ??? Macular drusen, bilateral    ??? Nuclear senile cataract of both eyes    ??? Osteopenia    ??? Palpitations    ??? Submandibular lymphadenopathy    ??? Vitreous floaters      Past Surgical History:   Procedure Laterality Date   ??? HX COLONOSCOPY  03/07/2019    [facility] - diverticulosis, polyp   ??? HX CYST REMOVAL  07/05/1981   ??? HX  HYSTERECTOMY  1999    prolaped uterus   ??? HX OOPHORECTOMY Bilateral 1999    prolapsed uterus     Family History   Problem Relation Age of Onset   ??? Breast Cancer Maternal Aunt         50's with recurrance   ??? Diabetes Mother    ??? Cataract Mother         Bilateral   ??? Breast Cancer Mother    ??? Diabetes Father    ??? Cataract Father         Bilateral   ??? Macular Degen Father    ??? Prostate Cancer Father    ??? Breast Cancer Sister    ??? Diabetes Other         Other family hx of   ??? Other Other         Grandmother - malignant tumor of colon, Grandfather - Bilateral degeneration of macula     Social History     Socioeconomic History   ??? Marital status: MARRIED     Spouse name: Not on file   ??? Number of children: Not on file   ??? Years of education: Not on file   ??? Highest education level: Not on file   Tobacco Use   ??? Smoking status: Never Smoker   ??? Smokeless tobacco: Never Used   Substance and Sexual Activity   ??? Alcohol use: Not Currently     Alcohol/week: 1.0 standard drinks     Types: 1 Glasses of wine per week   ??? Drug use: Never     Social Determinants of Company secretary Strain:    ??? Difficulty of Paying Living Expenses:    Food Insecurity:    ??? Worried About Charity fundraiser in the Last Year:    ??? Arboriculturist in the Last Year:    Transportation Needs:    ??? Film/video editor (Medical):    ??? Lack of Transportation (Non-Medical):    Physical Activity:    ??? Days of Exercise per Week:    ??? Minutes of Exercise per Session:    Stress:    ??? Feeling of Stress :    Social Connections:    ??? Frequency of Communication with Friends and Family:    ??? Frequency of Social Gatherings with Friends and Family:    ??? Attends Religious Services:    ??? Marine scientist or Organizations:    ??? Attends Archivist Meetings:    ??? Marital Status:           Patient Care Team:  Erven Colla, MD as PCP - General (Family Medicine)    Follow-up and Dispositions    ?? Return in about 1 year (around  01/31/2021) for MWV/PE.       Future Appointments   Date Time Provider [place]   02/02/2021  8:45 AM Erven Colla, MD [facility]         Erven Colla, MD, 02/01/2020  This encounter has been electronically signed

## 2020-02-01 NOTE — Telephone Encounter (Signed)
HIN abstracted. External records from [facility] have been reviewed and abstracted as well. I have called D[facility] Chase to request pathology report from pts colonoscopy done on 03/07/19 be faxed over.

## 2020-06-10 ENCOUNTER — Encounter

## 2020-06-10 MED ORDER — LOSARTAN 25 MG TAB
25 mg | ORAL_TABLET | Freq: Every day | ORAL | 1 refills | Status: DC
Start: 2020-06-10 — End: 2020-12-25

## 2020-06-10 NOTE — Telephone Encounter (Signed)
The pt called and left a message looking for a refill on her losartan. Please advise.

## 2020-06-10 NOTE — Telephone Encounter (Signed)
Call back needed: no  Medication(s): Losartan  Quantity: 90                                         Pharmacy:  [facility]  Prescriber:   Dr. Laurell Roof  Last appt @ PCP Office:  02/01/2020  Future Appointments   Date Time Provider Department Center   02/02/2021  8:45 AM Amparo Bristol, MD [facility]       MOST RECENT BLOOD PRESSURES  BP Readings from Last 3 Encounters:   02/01/20 138/86        MOST RECENT LAB DATA  Lab Results   Component Value Date/Time    CREATININE SER, EXTERNAL 0.85 07/13/2019 12:00 AM    Potassium, EXTERNAL 3.4 07/13/2019 12:00 AM

## 2020-11-17 ENCOUNTER — Telehealth

## 2020-11-17 NOTE — Telephone Encounter (Signed)
Telephone Encounter by Halina Maidens, RN at 11/17/20 1641                Author: Halina Maidens, RN  Service: --  Author Type: Registered Nurse       Filed: 11/17/20 1644  Encounter Date: 11/17/2020  Status: Signed          Editor: Halina Maidens, RN (Registered Nurse)               Please review if the patient is a candidate for Paxlovid and if not let me know if you want care management to send a referral for monoclonal therapy.       Candidates for Paxlovid   GFR>=30   No severe (Childs C cirrhosis) liver disease   Symptom onset 5 days or less   No drug interactions that would preclude therapy (can use Epic to check interactions as well as the [employer] (https://www.covid19-druginteractions.org/checker) and NIH guideline below)         The patient tested positive for COVID   Symptom Onset: 5/13   Today is Day # 3 of symptoms   [x]  Med Rec up to date as of 5/16      High Risk Conditions (checked all that apply)      Clinical risk factors   [x]  Age  ? 51   [x]  BMI  ? 30   []  Cancer   []  Cardiovascular disease   []  Chronic Kidney Disease (CKD)   []  Chronic Lung Disease   []  Diabetes, type 1 or type 2   []  Disabilities   []  Ethnic or racial minority   []  Immunosuppressed due to conditions or medications   []  Liver Disease   []  Pregnancy   []  Sickle Cell Disease   []  Substance Use Disorder   []  Physical Inactivity      Full list can be viewed at   Underlying Medical Conditions Associated  with Higher Risk for Severe COVID-19 (6/16 CDC)      Other Information:   No results found for: NA, K, CL, CO2, AGAP, GLU, BUN, CREA, BUCR, GFRAA, GFRNA, CA, TBIL, TBILI, AP, TP, ALB, GLOB, AGRAT, ALT, AST       The patient does not have a history of liver disease         Current meds:     Current Outpatient Medications        Medication  Sig         ?  losartan (COZAAR) 25 mg tablet  Take 1 Tablet by mouth daily.         ?  fluticasone propionate (Flonase Allergy Relief) 50 mcg/actuation nasal spray  1 Spray by Both  Nostrils route.         ?  antiox #8/om3/dha/epa/lut/zeax (PRESERVISION AREDS 2, OMEGA-3, PO)  Take  by mouth.     ?  red yeast rice extract 600 mg cap  Take 1,200 mg by mouth daily.     ?  triamcinolone acetonide (KENALOG) 0.1 % topical cream  Apply  to affected area as needed for Skin Irritation. use thin layer     ?  cholecalciferol (VITAMIN D3) (2,000 UNITS /50 MCG) cap capsule  Take  by mouth daily.         ?  hydroCHLOROthiazide (HYDRODIURIL) 25 mg tablet  Take 1 Tablet by mouth daily.              Guidelines of common medication interactions with  Paxlovid

## 2020-11-17 NOTE — Telephone Encounter (Signed)
I will send back to Steph to see if the pt is a candidate for the oral medication or MAB treatment.

## 2020-11-17 NOTE — Telephone Encounter (Signed)
The pt teted positive today for covid symptoms started Friday     The pt received the oral medication for her spouse and she would like a call to discuss getting this,   She said that she feels like she id getting better,   She said it is just like allergies,

## 2020-11-18 MED ORDER — NIRMATRELVIR 300 MG (150 MG X2)-RITONAVIR 100 MG TABLET,DOSE PACK(EUA)
300 mg (150 mg x 2)-100 mg | Freq: Two times a day (BID) | ORAL | 0 refills | Status: AC
Start: 2020-11-18 — End: 2020-11-23

## 2020-11-18 NOTE — Telephone Encounter (Signed)
Yes,  OK for paxlovid.  Please verify pharm.

## 2020-11-18 NOTE — Telephone Encounter (Signed)
Rx sent.

## 2020-11-18 NOTE — Telephone Encounter (Signed)
Pt left message waiting for a response to if she can get medication please call her

## 2020-11-18 NOTE — Telephone Encounter (Signed)
Pt would like this sent to [facility].

## 2020-12-25 ENCOUNTER — Encounter

## 2020-12-25 MED ORDER — HYDROCHLOROTHIAZIDE 25 MG TAB
25 mg | ORAL_TABLET | Freq: Every day | ORAL | 1 refills | Status: AC
Start: 2020-12-25 — End: ?

## 2020-12-25 MED ORDER — LOSARTAN 25 MG TAB
25 mg | ORAL_TABLET | Freq: Every day | ORAL | 1 refills | Status: AC
Start: 2020-12-25 — End: ?

## 2020-12-25 NOTE — Telephone Encounter (Signed)
 Medications Requested:  Requested Prescriptions     Pending Prescriptions Disp Refills   . hydroCHLOROthiazide  (HYDRODIURIL ) 25 mg tablet 90 Tablet 3     Sig: Take 1 Tablet by mouth daily.   . losartan  (COZAAR ) 25 mg tablet 90 Tablet 1     Sig: Take 1 Tablet by mouth daily.       Preferred Pharmacy:   [facility] - [place], [place] - [address]  [address]  [place] [address]  Phone: [phone] Fax: [phone]    [facility] [address] [place] - [address]  [address] [place] [address]  Phone: [phone] Fax: [phone]    Prescription Refill Protocol reviewed:  Yes    Medication: Hydrochlorothiazide   Last Office Visit: 1 year  Lab Monitoring: BMP or CMP within 1 year  Category: Ace- Inhibitors + Thiazide Diuretic  Length of refill: 1 year  Brand Name: No  Comments: If last BP >S140 or D90 then REFILL ONLY 1 and send note to provider    Allergy List Reviewed and Verified: Yes    Possible medication to medication interactions reviewed: Yes    Last appt @ PCP Office: 02/01/2020    Future Appointments   Date Time Provider Department Center   02/02/2021  8:45 AM Linward Dene PARAS, MD [facility]       MOST RECENT BLOOD PRESSURES  BP Readings from Last 3 Encounters:   02/01/20 138/86         MOST RECENT LAB DATA  Lab Results   Component Value Date/Time    CREATININE SER, EXTERNAL 0.85 07/13/2019 12:00 AM    Potassium, EXTERNAL 3.4 07/13/2019 12:00 AM

## 2020-12-25 NOTE — Telephone Encounter (Signed)
Katherine Osborne  12/07/1950      Call back needed: NO    Preferred call back number:    [phone] (home)    Telephone Information:   Mobile [phone]        Medications Requested: losartan 25 mg tablet & hydroCHLOROthiazide 25 mg tablet  Requested Prescriptions      No prescriptions requested or ordered in this encounter       Preferred Pharmacy: [facility] [place]   [facility] - [place], [place] - [address]  [address]  [place] [address]  Phone: [phone] Fax: [phone]    [facility] [address], [place] - [address]  [address] [place] [address]  Phone: [phone] Fax: [phone]

## 2020-12-31 ENCOUNTER — Telehealth

## 2020-12-31 NOTE — Telephone Encounter (Signed)
Would you like me to order a Lipid, and CMP?

## 2020-12-31 NOTE — Telephone Encounter (Signed)
The pt called and she has an appt for 02/02/21 she would like the routine labs added and sent to [facility] so they can get the done prior   Please let her know when sent   As a FYI she also wants you to know that she passed another kidney stone she said this is the 2nd one in a month , they do not seem to bother her much but wants to make sure you guys discuss this at the upcoming appt

## 2021-01-01 NOTE — Telephone Encounter (Signed)
Yes, CMP and lipid.

## 2021-01-01 NOTE — Telephone Encounter (Signed)
Patient is aware and labs have been sent to lab

## 2021-01-02 ENCOUNTER — Telehealth

## 2021-01-02 NOTE — Telephone Encounter (Signed)
Telephone Encounter by Mertie Clause, CMA at 01/02/21 1613                Author: Mertie Clause, CMA  Service: --  Author Type: Medical Assistant       Filed: 01/02/21 1624  Encounter Date: 01/02/2021  Status: Signed          Editor: Merrithew, Lynne Leader, CMA (Medical Assistant)               I called the pt back and she stated that she was in the ER today and was diagnosed with gallstones.  She said that they were going to do an [place] but they were so backed  up with outpatients that it was going to be hours.  The PA told her that he wasn't going to let her go home until he got her labs back, but her liver function was good so he felt comfortable sending her home.  Pt would like to know if she needs an US  done and if so can you please advise on ordering.  She was told she could take ibuprofen and they gave her a toradol shot.            Date of Service: 01/02/2021 12:00 PM    CT ABDOMEN/PELVIS W/O CONTRAST (IVP) CPT CODE: 56387 DATE: 01/02/2021  12:28 PM      EXAM: CT ABDOMEN/PELVIS W/O CONTRAST (IVP)    INDICATION: RIGHT FLANK PAIN .    COMPARISON: None  available.    TECHNIQUE: Region of study: Abdomen and pelvis. CT images acquired without intravenous  contrast nor oral contrast.    One or more of these dose optimization techniques were utilized: Automated exposure control;  mA  and/or kV adjustment per patient size (includes targeted exams where dose is matched to  clinical indication); or iterative reconstruction.    FINDINGS: Note that absence of intravenous contrast limits evaluation of solid visceral organs,   and reduces sensitivity for detection of active inflammation, infection, and mass lesions.    LUNG BASES: Clear.    LIVER: Smooth in contour. Diffuse hepatic steatosis.    BILIARY SYSTEM: No gallbladder wall thickening or pericholecystic  fat stranding. Multiple  small gallstones. The gallbladder is mildly distended. No biliary ductal dilatation.      DIAGNOSTIC IMAGING  Page 1  of 3  [facility]  Name: Katherine Osborne, Katherine Osborne  DOB: 07-19-1950  Age: 6 Sex: F X-Ray #: [id number]  Account #: [id number] Patient Type: ER/OPS 020 ED 01  Patient Phone #: [phone] Admit: 01/02/2021 11:39 AM  Med Record #: [id number] Disc:  Accession: [id number]    Unsigned Transcriptions are preliminary  reports and do not represent a medical or legal document.    RADIOLOGY REPORT - FINAL RADIOLOGY    PANCREAS: Unremarkable.    SPLEEN: Unremarkable.    ADRENAL GLANDS: Unremarkable.    KIDNEYS: No contour abnormalities.  No hydroureteronephrosis. No calcified stones. No  pathologic perinephric or periureteral fat stranding to indicate inflammation or a recently passed  stone.    URINARY BLADDER: No focal wall thickening or pericystic fat stranding. No bladder   calculi.    PELVIC ORGANS: Status post hysterectomy and probably oophorectomy.    BOWEL: Limited evaluation without intravenous or oral contrast. Normal caliber. No  obstruction or ileus. No wall thickening or fat stranding. Scattered  colonic diverticula, with no  evidence of acute diverticulitis. Appendix visualized and within normal limits.    PERITONEUM:  No ascites. No extraluminal air.    LYMPH NODES: No lymphadenopathy.    ABDOMINAL AORTA: No aneurysm.  Scattered vascular calcifications.    INFERIOR VENA CAVA: Normal contour.    ABDOMINAL WALL: No hernias.    BONES: No acute fracture or suspicious lesions. 44 mild degenerative changes of the spine.    SOFT TISSUES: Unremarkable.     IMPRESSION: Cholelithiasis. Mildly distended gallbladder, nonspecific. No significant  pericholecystic inflammatory changes. If indicated, consider further evaluation with right upper  quadrant ultrasound.    DIAGNOSTIC IMAGING  Page  2 of 3  [facility]  Name: Katherine Osborne, Katherine Osborne  DOB: [birth year] Age: 70 Sex: F X-Ray #: [id number]  Account #: [id number] Patient Type: ER/OPS 020 ED 01  Patient Phone #: [phone] Admit: 01/02/2021 11:39 AM  Med Record  #:  [id number] Disc:  Accession: [id number]    Unsigned Transcriptions are preliminary reports and do not represent a medical or legal document.    RADIOLOGY REPORT - FINAL RADIOLOGY    Diffuse hepatic steatosis.    Status  post hysterectomy and probably oophorectomy.      Read by: Candie Chroman  Electronically Signed by: Candie Chroman  Sign: 01/02/2021 1:06 PM  Trandate/initial:  Dictation Location: [id number]

## 2021-01-02 NOTE — Telephone Encounter (Signed)
Cassie is this something that you can advise on or do you want me to send to Dr. Laurell Roof to advise on Tuesday?

## 2021-01-02 NOTE — Telephone Encounter (Signed)
The pt left a VM that she does not have kidney stones she has gallstones, she just the ER @ [facility]   She asked for a call on this

## 2021-01-02 NOTE — Telephone Encounter (Signed)
I called patient. Her LFTs were normal. She states feeling much better. No RUQ pain. I recommend her having RUQ ultrasound outpatient for follow up and to avoid fatty and rich foods.   If acute pain, nausea, vomiting or fevers arise to go to ED.

## 2021-01-14 ENCOUNTER — Ambulatory Visit: Primary: Family Medicine

## 2021-01-14 ENCOUNTER — Telehealth

## 2021-01-14 ENCOUNTER — Encounter

## 2021-01-14 DIAGNOSIS — K8020 Calculus of gallbladder without cholecystitis without obstruction: Secondary | ICD-10-CM

## 2021-01-14 NOTE — Telephone Encounter (Signed)
Let her know her ultrasound showed a fatty liver (we will keep an eye on her liver enzymes, and she should work on weight loss) and many gall stones.  I'd recommend she see General Surgery to discuss gallbladder removal.

## 2021-01-15 NOTE — Telephone Encounter (Signed)
Pt is aware.

## 2021-01-15 NOTE — Telephone Encounter (Signed)
Lm for pt to call me back to tell her.

## 2021-01-15 NOTE — Telephone Encounter (Signed)
Please sign referral if appropriate. Please advise what would cause patient to have a Fatty Liver?

## 2021-01-15 NOTE — Telephone Encounter (Signed)
The pt is aware of results and would like the referral but in . Please put in a referral to a general surgery that you would recommend.   The pt asked what would cause a fatty liver she will not be around after 4 PM

## 2021-01-15 NOTE — Telephone Encounter (Signed)
Gen surg referral in. Fatty liver is a relatively common finding. It can be caused by being overweight or high cholesterol.

## 2021-01-22 ENCOUNTER — Ambulatory Visit: Attending: Surgery | Primary: Family Medicine

## 2021-01-22 ENCOUNTER — Ambulatory Visit: Admit: 2021-01-22 | Discharge: 2021-01-22 | Payer: MEDICARE | Attending: Surgery | Primary: Family Medicine

## 2021-01-22 DIAGNOSIS — K8020 Calculus of gallbladder without cholecystitis without obstruction: Secondary | ICD-10-CM

## 2021-01-22 NOTE — H&P (Signed)
 H&P by Dorma Dickey RAMAN, CMA at 01/22/21 1230                Author: Dorma Dickey RAMAN, CMA  Service: --  Author Type: Medical Assistant       Filed: 01/22/21 1310  Encounter Date: 01/22/2021  Status: Signed          Editor: GomezValencia, Raynell SQUIBB, MD (Physician)                  [facility]    [address]   [place] [address]   [phone]        CC     Symptomatic cholelithiasis        HPI     Katherine Osborne is a 70 y.o. female who presents to the practice referred by her PCP: Katherine Mood, MD due to symptomatic cholelithiasis      70yo F w/ PMHx significant for obesity class 1, HTN, heaptic steatosis, hysterectomy      Patient consulted [facility] ER on 01/02/21 due to complaints of severe right flank pain with some nausea. This was also  noted to be worsened with food intake. The pain radiated to the back. Described as sharp and colicky. She thought was passing a kidney stone. A CT A/P showed Cholelithiasis  with a mildly distended gallbladder. No significant   pericholecystic inflammatory changes. Significant images reviewed by me below              The patient had persistent symptoms and thus an outpatient RUQ ultrasound was performed on 01/14/21 ([facility]) showing a gallbladder filled with stones. No pericholecystic fluid  is seen. No intrahepatic or extrahepatic biliary ductal dilation.              The patient tells me this is affecting her quality of life. She has been very cautious about what she eats since this makes the pain return. Has been nauseous but without emesis. She would like to proceed  with surgical intervention next available date      Denies fever or chills. No weight loss. No jaundice. No pale stools or hyperpigmented urine.      Most recent LFTs from 01/13/21 were normal.       She does not take blood thinners or immunosuppressants      Retired Product manager      Lives in [place] with her  husband           Past Medical History:        Diagnosis  Date         ?  Back pain       ?  Bilateral cataracts       ?  Contusion of left breast       ?  Diverticulitis       ?  Epiretinal membrane            Bilateral         ?  H/O seasonal allergies       ?  Hyperlipidemia       ?  Hypertension       ?  Hypertensive disorder       ?  Hypertensive retinopathy of both eyes       ?  Hypokalemia       ?  Kidney stone       ?  Macular drusen, bilateral       ?  Nuclear senile cataract of both eyes       ?  Osteopenia       ?  Palpitations       ?  Submandibular lymphadenopathy           ?  Vitreous floaters            Past Surgical History:         Procedure  Laterality  Date          ?  HX COLONOSCOPY    03/07/2019          [facility] - Colonoscopy diverticulosis, polyp Oneil Rhyme           ?  HX CYST REMOVAL    07/05/1981     ?  HX HYSTERECTOMY    1999          prolaped uterus          ?  HX OOPHORECTOMY  Bilateral  1999          prolapsed uterus          Family History         Problem  Relation  Age of Onset          ?  Breast Cancer  Maternal Aunt                50's with recurrance          ?  Diabetes  Mother       ?  Cataract  Mother                Bilateral          ?  Breast Cancer  Mother                malignant tumor          ?  Diabetes  Father       ?  Cataract  Father                Bilateral          ?  Macular Degen  Father       ?  Prostate Cancer  Father                malignant          ?  Breast Cancer  Sister       ?  Diabetes  Other                Other family hx of          ?  Other  Other                Grandmother - malignant tumor of colon, Grandfather - Bilateral degeneration of macula          Social History          Socioeconomic History         ?  Marital status:  MARRIED              Spouse name:  Not on file         ?  Number of children:  Not on file     ?  Years of education:  Not on file     ?  Highest education level:  Not on file       Occupational History        ?   Not on  file       Tobacco Use         ?  Smoking status:  Never     ?  Smokeless tobacco:  Never       Substance and Sexual Activity         ?  Alcohol use:  Not Currently              Alcohol/week:  1.0 standard drink         Types:  1 Glasses of wine per week         ?  Drug use:  Never     ?  Sexual activity:  Not on file        Other Topics  Concern        ?  Not on file       Social History Narrative        ?  Not on file          Social Determinants of Health          Financial Resource Strain: Not on file     Food Insecurity: Not on file     Transportation Needs: Not on file     Physical Activity: Not on file     Stress: Not on file     Social Connections: Not on file     Intimate Partner Violence: Not on file       Housing Stability: Not on file          Current Outpatient Medications          Medication  Sig  Dispense  Refill           ?  hydroCHLOROthiazide  (HYDRODIURIL ) 25 mg tablet  Take 1 Tablet by mouth daily.  90 Tablet  1     ?  losartan  (COZAAR ) 25 mg tablet  Take 1 Tablet by mouth daily.  90 Tablet  1     ?  fluticasone propionate (Flonase Allergy Relief) 50 mcg/actuation nasal spray  1 Spray by Both Nostrils route.         ?  antiox #8/om3/dha/epa/lut/zeax (PRESERVISION AREDS 2, OMEGA-3, PO)  Take  by mouth.         ?  red yeast rice extract 600 mg cap  Take 1,200 mg by mouth daily.         ?  triamcinolone acetonide (KENALOG) 0.1 % topical cream  Apply  to affected area as needed for Skin Irritation. use thin layer               ?  cholecalciferol (VITAMIN D3) (2,000 UNITS /50 MCG) cap capsule  Take  by mouth daily.              Allergies        Allergen  Reactions         ?  Amoxicillin  Unknown (comments)         ?  Clindamycin  Unknown (comments)              The medications were reviewed and updated in the medical record.   The past medical history, past surgical history, and family history were reviewed and updated in the medical record.        REVIEW OF SYSTEMS     Review of Systems     Constitutional:  Positive for weight loss. Negative for chills, diaphoresis, fever and malaise/fatigue.    HENT: Negative.  Negative for congestion, ear discharge, ear pain, hearing loss, nosebleeds, sinus pain, sore throat and tinnitus.     Eyes: Negative.  Negative for blurred vision, double vision, photophobia, pain, discharge and redness.    Respiratory: Negative.  Negative for cough, hemoptysis, sputum production, shortness of breath, wheezing and stridor.     Cardiovascular: Negative.  Negative for chest pain, palpitations, orthopnea, claudication, leg swelling and PND.    Gastrointestinal:  Positive for nausea. Negative for abdominal pain, blood in stool, constipation, diarrhea, heartburn, melena and vomiting.     Genitourinary: Negative.  Negative for dysuria, flank pain, frequency, hematuria and urgency.    Musculoskeletal: Negative.  Negative for back pain, falls, joint pain, myalgias and neck pain.    Skin: Negative.  Negative for itching and rash.    Neurological: Negative.  Negative for dizziness, tingling, tremors, sensory change, speech change, focal weakness, seizures, loss of consciousness, weakness and headaches.    Endo/Heme/Allergies: Negative.  Negative for environmental allergies and polydipsia. Does  not bruise/bleed easily.    Psychiatric/Behavioral: Negative.  Negative for depression, hallucinations, memory loss,  substance abuse and suicidal ideas. The patient is not nervous/anxious and does not have insomnia.          PHYSICAL EXAM        Visit Vitals      BP  (!) (P) 140/72 (BP 1 Location: Left upper arm, BP Patient Position: Sitting, BP Cuff Size: Large adult)     Pulse  (P) 64     Temp  (P) 97.6 F (36.4 C) (Temporal)     Ht  (P) 5' 5 (1.651 m)     Wt  (P) 177 lb (80.3 kg)     SpO2  (P) 98%        BMI  (P) 29.45 kg/m              Physical Exam   Constitutional :        General: She is not in acute distress.      Appearance: She is not ill-appearing.     Cardiovascular:       Rate and  Rhythm: Normal rate.       Pulses: Normal pulses.       Heart sounds: Normal heart sounds.    Pulmonary:       Effort: Pulmonary effort is normal.       Breath sounds: Normal breath sounds.     Abdominal:       General: There is no distension.       Palpations: There is no mass.       Tenderness: There is no abdominal tenderness.       Hernia: No hernia is present.    Skin:      General: Skin is warm.       Coloration: Skin is not jaundiced.    Neurological:       General: No focal deficit present.       Mental Status: She is alert and oriented to person, place, and time.    Psychiatric:          Osborne and Affect: Osborne normal.          Behavior: Behavior normal.                     DATA     Most Recent Lab Result        Lab Results  Component  Value  Date/Time            Sodium, EXTERNAL  140  07/13/2019 12:00 AM       Potassium, EXTERNAL  3.4  07/13/2019 12:00 AM       Chloride, EXTERNAL  102  07/13/2019 12:00 AM       CO2, EXTERNAL  27  07/13/2019 12:00 AM       Glucose SER, EXTERNAL  104  07/13/2019 12:00 AM       BUN, EXTERNAL  14  07/13/2019 12:00 AM            CREATININE SER, EXTERNAL  0.85  07/13/2019 12:00 AM             Lab Results         Component  Value  Date/Time            Calcium, EXTERNAL  8.9  07/13/2019 12:00 AM       BILI TOTAL, EXTERNAL  1.0  07/13/2019 12:00 AM       SGPT (ALT), EXTERNAL  25  07/13/2019 12:00 AM       SGOT (AST), EXTERNAL  20  07/13/2019 12:00 AM       ALK PHOS, EXTERNAL  49  07/13/2019 12:00 AM       Protein TOT, EXTERNAL  7.2  07/13/2019 12:00 AM            ALBUMIN, EXTERNAL  4.3  07/13/2019 12:00 AM             Lab Results         Component  Value  Date/Time            Hep C Antibody, External  Non-Reactive  11/29/2014 12:00 AM              US  Abdomen limited    [facility]    [address] [place], [place] [address]    [phone]       DIAGNOSTIC IMAGING      Name: GIULIANA, HANDYSIDE   DOB: Mar 24, 1951 Age: 36 Sex: F X-Ray #: [id number]   Account #:  [id number] Patient Type: O/P   Patient Phone #: [phone] Admit: 01/14/2021 8:48 AM   Med Record #: [id number] Disc: 01/14/2021 8:49 AM    Accession: [id number]   Admitting Physician: CINDIE DORMAN DRONE   Family Physician: LINWARD Katherine PARAS Legrand Transcriptions are preliminary reports and do not represent a medical or legal document.      RADIOLOGY REPORT - FINAL RADIOLOGY      Date of Service: 01/14/2021 8:53 AM      US  ABDOMEN LIMITED/MISC CPT CODE: 23294 DATE: 01/14/2021 9:25 AM         EXAM: US  ABDOMEN LIMITED/MISC      INDICATION: RUQ PAIN DETECTION OF GALLSTONES ON AB PELVIC .      COMPARISON: Correlation is made to CT from January 02, 2021.      TECHNIQUE: Region of study: Right upper quadrant abdomen .      Grayscale and color Doppler sonographic images acquired by a medical sonographer.      FINDINGS: LIVER: Echogenic liver parenchyma, suggesting diffuse hepatic steatosis. No   focal mass. Smooth contour of the hepatic capsule. Main portal vein is patent with hepatopetal   flow.      GALLBLADDER: There is a wall-echo-shadow appearance of the gallbladder fossa, suggestive   of a gallbladder filled  with stones. No pericholecystic fluid is seen. Sonographer reports a   negative sonographic Murphy sign.      BILIARY TREE: No intrahepatic or extrahepatic biliary ductal dilation.      CBD: Normal caliber.      RIGHT KIDNEY: No hydronephrosis or calculi. Right kidney measures 10.9 cm in   craniocaudal dimension. No right renal cortical mass is demonstrated.    DIAGNOSTIC IMAGING   Page 1 of 2    [facility]   Name: BRANDACE, CARGLE   DOB: Nov 13, 1950 Age: 2 Sex: F X-Ray #: [id number]   Account #: [id number] Patient Type: O/P   Patient Phone #: [phone] Admit: 01/14/2021 8:48 AM   Med Record #: [id number] Disc: 01/14/2021 8:49 AM    Accession: [id number]      Unsigned Transcriptions are preliminary reports and do not represent a medical or legal document.      RADIOLOGY REPORT - FINAL  RADIOLOGY         PANCREAS: Essentially obscured by bowel gas.      FLUID: No ascites/free fluid.      IMPRESSION: Wall-echo-shadow appearance of the gallbladder fossa, suggestive of a   gallbladder filled with stones. Sonographer indicated and negative sonographic Murphy sign and   there is no compelling evidence of cholecystitis. No biliary dilation.      Hepatic steatosis.      Pancreas is largely obscured by bowel gas, precluding its evaluation.         Read by: Emeline Shivers, MD   Electronically Signed by: Emeline Shivers, MD   Sign: 01/14/2021 10:01 AM   Trandate/initial:   Dictation Location: [id number]                DIAGNOSTIC IMAGING   Page 2 of 2            [employer] Source Data            CT Abdomen WO contrast    [facility]    [address] [place], [place] [address]    [id number]       DIAGNOSTIC IMAGING      Name: KEASIA, DUBOSE   DOB: August 08, 1950 Age: 24 Sex: F X-Ray #: [id number]   Account #: [id number] Patient Type: ER/OPS 020 ED 01   Patient Phone #: [phone] Admit: 01/02/2021 11:39 AM   Med Record #: [id number] Disc:    Accession: [id number]   Admitting Physician: OCONNOR, SHAUN PA   Family Physician: LINWARD Katherine PARAS Legrand Transcriptions are preliminary reports and do not represent a medical or legal document.      RADIOLOGY REPORT - FINAL RADIOLOGY      Date of Service: 01/02/2021 12:00 PM      CT ABDOMEN/PELVIS W/O CONTRAST (IVP) CPT CODE: 25823 DATE: 01/02/2021   12:28 PM         EXAM: CT ABDOMEN/PELVIS W/O CONTRAST (IVP)      INDICATION: RIGHT FLANK PAIN .      COMPARISON: None available.      TECHNIQUE: Region of study: Abdomen and pelvis. CT images acquired without intravenous   contrast nor oral contrast.      One or more of these dose optimization techniques were utilized: Automated exposure control;   mA and/or kV adjustment per patient size (includes targeted exams where dose is matched to   clinical indication); or iterative reconstruction.      FINDINGS:  Note that absence of  intravenous contrast limits evaluation of solid visceral organs,   and reduces sensitivity for detection of active inflammation, infection, and mass lesions.      LUNG BASES: Clear.      LIVER: Smooth in contour. Diffuse hepatic steatosis.      BILIARY SYSTEM: No gallbladder wall thickening or pericholecystic fat stranding. Multiple   small gallstones. The gallbladder is mildly distended. No biliary ductal dilatation.          DIAGNOSTIC IMAGING   Page 1 of 3    [facility]   Name: VENNA, BERBERICH   DOB: 24-Sep-1950 Age: 58 Sex: F X-Ray #: [id number]   Account #: [id number] Patient Type: ER/OPS 020 ED 01   Patient Phone #: [phone] Admit: 01/02/2021 11:39 AM   Med Record #: [id number] Disc:    Accession: [id number]      Unsigned Transcriptions are preliminary reports and do not represent a medical or legal document.      RADIOLOGY REPORT - FINAL RADIOLOGY      PANCREAS: Unremarkable.      SPLEEN: Unremarkable.      ADRENAL GLANDS: Unremarkable.      KIDNEYS: No contour abnormalities. No hydroureteronephrosis. No calcified stones. No   pathologic perinephric or periureteral fat stranding to indicate inflammation or a recently passed   stone.      URINARY BLADDER: No focal wall thickening or pericystic fat stranding. No bladder   calculi.      PELVIC ORGANS: Status post hysterectomy and probably oophorectomy.      BOWEL: Limited evaluation without intravenous or oral contrast. Normal caliber. No   obstruction or ileus. No wall thickening or fat stranding. Scattered colonic diverticula, with no   evidence of acute diverticulitis. Appendix visualized and within normal limits.      PERITONEUM: No ascites. No extraluminal air.      LYMPH NODES: No lymphadenopathy.      ABDOMINAL AORTA: No aneurysm. Scattered vascular calcifications.      INFERIOR VENA CAVA: Normal contour.      ABDOMINAL WALL: No hernias.      BONES: No acute fracture or suspicious lesions. 44 mild degenerative changes  of the spine.      SOFT TISSUES: Unremarkable.      IMPRESSION: Cholelithiasis. Mildly distended gallbladder, nonspecific. No significant   pericholecystic inflammatory changes. If indicated, consider further evaluation with right upper   quadrant ultrasound.       DIAGNOSTIC IMAGING   Page 2 of 3    [facility]   Name: NYELLE, WOLFSON   DOB: 05-Aug-1950 Age: 29 Sex: F X-Ray #: [id number]   Account #: [id number] Patient Type: ER/OPS 020 ED 01   Patient Phone #: [phone] Admit: 01/02/2021 11:39 AM   Med Record #: [id number] Disc:    Accession: [id number]      Unsigned Transcriptions are preliminary reports and do not represent a medical or legal document.      RADIOLOGY REPORT - FINAL RADIOLOGY      Diffuse hepatic steatosis.      Status post hysterectomy and probably oophorectomy.         Read by: Aldona Coca   Electronically Signed by: Aldona Coca   Sign: 01/02/2021 1:06 PM   Trandate/initial:   Dictation Location: [id number]                DIAGNOSTIC IMAGING   Page 3 of 3  [employer] Source Data                      IMPRESSION        70yo F w/ PMHx significant for obesity class 1, HTN, heaptic steatosis, hysterectomy      The patient presents with symptomatic cholelithiasis with recurrent biliary attack. She has evidence of gallbladder disease both on her CT scan and RUQ ultrasound. Her most recent LFTs do not show an obstructive pattern but she has significant stone burden.  I have discussed with the patient my recommendations for lap cholecystectomy with IOC.      I discussed with patient risks and benefits of laparoscopic cholecystectomy + IOC. Risks including, but not limited to bleeding, infection, bile leak, injury to bile ducts, bowel, liver, blood vessels, nerves and surrounding organs. Need to convert to  open operation, DVT, PE, death among others. Patient expressed understanding and wishes to proceed with planned operation.            RECOMMENDATIONS        Informed consent  was signed   Risks / benefits were discussed with the patient   Surgical date: 01/29/21   Alarm signs were given   All questions answered         A written copy of this report with its recommendations has been sent to the requesting provider            Raynell SHAUNNA Loffler, MD   01/22/2021

## 2021-01-22 NOTE — Telephone Encounter (Signed)
I called the pt and she is currently on her way to the surgeon to discuss getting her gallbladder removed.  She is scheduled to see [place] on the 1st and would like to discuss medication at her visit.

## 2021-01-22 NOTE — Telephone Encounter (Signed)
Let her know I got her labs from [facility].  Her cholesterol is significantly worse than last check.  I do think we should start her on a low dose statin.

## 2021-01-23 NOTE — Anesthesia Pre-Procedure Evaluation (Signed)
Relevant Problems   CARDIOVASCULAR   (+) Essential hypertension      GASTROINTESTINAL   (+) Fatty liver       Anesthetic History               Review of Systems / Medical History  Patient summary reviewed and pertinent labs reviewed    Pulmonary  Within defined limits                 Neuro/Psych   Within defined limits           Cardiovascular    Hypertension              Exercise tolerance: >4 METS  Comments: Katherine Osborne works in her garden almost every day, about hour per day.   GI/Hepatic/Renal           PUD and liver disease    Comments: Fatty liver Endo/Other  Within defined limits           Other Findings            Physical Exam    Airway  Mallampati: II  TM Distance: > 6 cm  Neck ROM: normal range of motion   Mouth opening: Normal     Cardiovascular    Rhythm: regular  Rate: normal         Dental  No notable dental hx       Pulmonary  Breath sounds clear to auscultation               Abdominal         Other Findings            Anesthetic Plan    ASA: 2  Anesthesia type: general          Induction: Intravenous  Anesthetic plan and risks discussed with: Patient      NPO status, allergies and medication history reviewed.

## 2021-01-26 NOTE — Interval H&P Note (Signed)
A Pre-Admission Testing Assessment phone call was completed and the following instructions were reviewed with the patient and verbalized understanding of all instructions.     The following preoperative instructions were reviewed:     On the day of your surgery, please arrive to the [place] entrance of [facility], located at [address] in [place].  Valet parking is available at no cost.  You will be directed to the Surgical Center on the third floor.    Date of Surgery/Procedure: 7/28   Arrival Time:   Surgery/Procedure Time:     You must have an adult with you to drive you home.  You must also have a responsible person to stay with you after surgery and during the night.  You should not drive a car for 24 hours following your surgery.    Do not eat after midnight on the night before surgery. Clears no dairy up until 2 hrs before sx then nothing until after sx    Please follow your cardiologist instructions for aspirin.  If you are currently taking Plavix, Coumadin, or other blood-thinning agents, contact your surgeon for instructions.    Please wear clean, comfortable loose fitting clothes.      You mush shower/wash your entire body from head to toe before arriving for your surgery.  Do not apply deodorants, lotions, makeup, perfumes after you wash on the day of surgery.    Remove fingernail and toenail polish.    Remove all jewelry (including wedding rings) on the day of surgery.  Remove all body piercings..    Please wear your hair loose or down, no pony-tails or buns, and no bobby pins or clips.      Do not shave near the surgical site.    If you are being admitted, we encourage you to bring personal hygiene items.  You may bring laptop computers, tablets, cell phones, books, and maybe a favorite pillow for your comfort. [facility] is not responsible for loss of any personal items.    If you use a CPAP machine, please bring only the mask with you.  It can be attached to the hospital machines.   Please be ready to let the staff know your CPAP settings.    Please bring a current medication list.    It is very important that you be on time. If a situation occurs where you may be delayed or late, please call: [phone] on the day of surgery.    If you have any questions, concerns, or problems, please call [phone] to speak with someone in Pre-Admission Testing.    Special Instructions:      Medications to stop taking NOW:    Medications to take the morning of surgery with a sip of water:   Fluticasone if needed, pt told to stop preservision and red yeast rice now      Pre-Op Instructions provided by: Eulas Post, RN  01/26/2021    1:01 PM

## 2021-01-28 MED FILL — CEFAZOLIN 10 GRAM SOLUTION FOR INJECTION: 10 gram | INTRAMUSCULAR | Qty: 2000

## 2021-01-29 ENCOUNTER — Ambulatory Visit: Admit: 2021-01-29 | Payer: MEDICARE | Primary: Family Medicine

## 2021-01-29 ENCOUNTER — Inpatient Hospital Stay: Payer: MEDICARE

## 2021-01-29 LAB — POC COVID-19
COVID-19 POC: NEGATIVE
Kit Lot No.: 181302

## 2021-01-29 LAB — AMB POC COVID-19 COV
COVID-19 POC: NEGATIVE
KIT LOT NO.: 181302

## 2021-01-29 MED ORDER — GLYCOPYRROLATE 0.2 MG/ML IJ SOLN
0.2 mg/mL | INTRAMUSCULAR | Status: DC | PRN
Start: 2021-01-29 — End: 2021-01-29
  Administered 2021-01-29: 11:00:00 via INTRAVENOUS

## 2021-01-29 MED ORDER — SODIUM CHLORIDE 0.9 % IV
10 gram | Freq: Once | INTRAVENOUS | Status: AC
Start: 2021-01-29 — End: 2021-01-29
  Administered 2021-01-29: 11:00:00 via INTRAVENOUS

## 2021-01-29 MED ORDER — DEXAMETHASONE SODIUM PHOSPHATE (PF) 10 MG/ML INJECTION
10 mg/mL | INTRAMUSCULAR | Status: AC
Start: 2021-01-29 — End: ?

## 2021-01-29 MED ORDER — GLYCOPYRROLATE(PF) 0.4 MG/2 ML (0.2 MG/ML) IN STERILE WATER IV SYRINGE
0.4 mg/2 mL (0.2 mg/mL) | INTRAVENOUS | Status: AC
Start: 2021-01-29 — End: ?

## 2021-01-29 MED ORDER — DEXAMETHASONE SODIUM PHOSPHATE 10 MG/ML IJ SOLN
10 mg/mL | INTRAMUSCULAR | Status: DC | PRN
Start: 2021-01-29 — End: 2021-01-29
  Administered 2021-01-29: 12:00:00 via INTRAVENOUS

## 2021-01-29 MED ORDER — DEXMEDETOMIDINE 100 MCG/ML IV SOLN
100 mcg/mL | INTRAVENOUS | Status: AC
Start: 2021-01-29 — End: ?

## 2021-01-29 MED ORDER — BUPIVACAINE (PF) 0.5 % (5 MG/ML) IJ SOLN
0.5 % (5 mg/mL) | INTRAMUSCULAR | Status: AC
Start: 2021-01-29 — End: ?

## 2021-01-29 MED ORDER — BUPIVACAINE (PF) 0.5 % (5 MG/ML) IJ SOLN
0.5 % (5 mg/mL) | INTRAMUSCULAR | Status: DC | PRN
Start: 2021-01-29 — End: 2021-01-29
  Administered 2021-01-29: 12:00:00 via SUBCUTANEOUS

## 2021-01-29 MED ORDER — DEXMEDETOMIDINE 100 MCG/ML IV SOLN
100 mcg/mL | INTRAVENOUS | Status: DC | PRN
Start: 2021-01-29 — End: 2021-01-29
  Administered 2021-01-29: 11:00:00 via INTRAVENOUS

## 2021-01-29 MED ORDER — MIDAZOLAM 1 MG/ML IJ SOLN
1 mg/mL | INTRAMUSCULAR | Status: DC | PRN
Start: 2021-01-29 — End: 2021-01-29
  Administered 2021-01-29: 11:00:00 via INTRAVENOUS

## 2021-01-29 MED ORDER — IOHEXOL 300 MG/ML IV SOLN
300 mg iodine/mL | INTRAVENOUS | Status: AC
Start: 2021-01-29 — End: ?

## 2021-01-29 MED ORDER — MIDAZOLAM 1 MG/ML IJ SOLN
1 mg/mL | INTRAMUSCULAR | Status: AC
Start: 2021-01-29 — End: ?

## 2021-01-29 MED ORDER — HYDROMORPHONE 0.5 MG/0.5 ML SYRINGE
0.5 mg/ mL | INTRAMUSCULAR | Status: DC | PRN
Start: 2021-01-29 — End: 2021-01-29

## 2021-01-29 MED ORDER — ONDANSETRON (PF) 4 MG/2 ML INJECTION
4 mg/2 mL | INTRAMUSCULAR | Status: AC
Start: 2021-01-29 — End: ?

## 2021-01-29 MED ORDER — HALOPERIDOL LACTATE 5 MG/ML IJ SOLN
5 mg/mL | Freq: Once | INTRAMUSCULAR | Status: AC | PRN
Start: 2021-01-29 — End: 2021-01-29
  Administered 2021-01-29: 15:00:00 via INTRAVENOUS

## 2021-01-29 MED ORDER — FENTANYL CITRATE (PF) 50 MCG/ML IJ SOLN
50 mcg/mL | INTRAMUSCULAR | Status: DC | PRN
Start: 2021-01-29 — End: 2021-01-29

## 2021-01-29 MED ORDER — HYDROCODONE-ACETAMINOPHEN 5 MG-325 MG TAB
5-325 mg | ORAL_TABLET | Freq: Four times a day (QID) | ORAL | 0 refills | Status: AC | PRN
Start: 2021-01-29 — End: 2021-02-03

## 2021-01-29 MED ORDER — PROPOFOL 10 MG/ML IV EMUL
10 mg/mL | INTRAVENOUS | Status: DC | PRN
Start: 2021-01-29 — End: 2021-01-29
  Administered 2021-01-29: 11:00:00 via INTRAVENOUS

## 2021-01-29 MED ORDER — ONDANSETRON (PF) 4 MG/2 ML INJECTION
4 mg/2 mL | INTRAMUSCULAR | Status: DC | PRN
Start: 2021-01-29 — End: 2021-01-29
  Administered 2021-01-29: 14:00:00 via INTRAVENOUS

## 2021-01-29 MED ORDER — SUGAMMADEX 100 MG/ML INTRAVENOUS SOLUTION
100 mg/mL | INTRAVENOUS | Status: AC
Start: 2021-01-29 — End: ?

## 2021-01-29 MED ORDER — ROCURONIUM 10 MG/ML IV
10 mg/mL | INTRAVENOUS | Status: DC | PRN
Start: 2021-01-29 — End: 2021-01-29
  Administered 2021-01-29: 12:00:00 via INTRAVENOUS

## 2021-01-29 MED ORDER — KETAMINE 20 MG/2 ML (10 MG/ML) IN 0.9 % SODIUM CHLORIDE IV SYRINGE
20 mg/2 mL (10 mg/mL) | INTRAVENOUS | Status: AC
Start: 2021-01-29 — End: ?

## 2021-01-29 MED ORDER — FENTANYL CITRATE (PF) 50 MCG/ML IJ SOLN
50 mcg/mL | INTRAMUSCULAR | Status: AC
Start: 2021-01-29 — End: ?

## 2021-01-29 MED ORDER — FENTANYL CITRATE (PF) 50 MCG/ML IJ SOLN
50 mcg/mL | INTRAMUSCULAR | Status: DC | PRN
Start: 2021-01-29 — End: 2021-01-29
  Administered 2021-01-29 (×5): via INTRAVENOUS

## 2021-01-29 MED ORDER — OXYCODONE 5 MG TAB
5 mg | Freq: Once | ORAL | Status: DC | PRN
Start: 2021-01-29 — End: 2021-01-29

## 2021-01-29 MED ORDER — LIDOCAINE (PF) 20 MG/ML (2 %) IJ SOLN
20 mg/mL (2 %) | INTRAMUSCULAR | Status: AC
Start: 2021-01-29 — End: ?

## 2021-01-29 MED ORDER — KETAMINE 10 MG/ML IJ SOLN
10 mg/mL | INTRAMUSCULAR | Status: DC | PRN
Start: 2021-01-29 — End: 2021-01-29
  Administered 2021-01-29 (×2): via INTRAVENOUS

## 2021-01-29 MED ORDER — LIDOCAINE (PF) 10 MG/ML (1 %) IJ SOLN
10 mg/mL (1 %) | INTRAMUSCULAR | Status: DC | PRN
Start: 2021-01-29 — End: 2021-01-29

## 2021-01-29 MED ORDER — LIDOCAINE (PF) 20 MG/ML (2 %) IV SYRINGE
100 mg/5 mL (2 %) | INTRAVENOUS | Status: DC | PRN
Start: 2021-01-29 — End: 2021-01-29
  Administered 2021-01-29: 11:00:00 via INTRAVENOUS

## 2021-01-29 MED ORDER — SUGAMMADEX 100 MG/ML INTRAVENOUS SOLUTION
100 mg/mL | INTRAVENOUS | Status: DC | PRN
Start: 2021-01-29 — End: 2021-01-29
  Administered 2021-01-29: 13:00:00 via INTRAVENOUS

## 2021-01-29 MED ORDER — PROPOFOL 10 MG/ML IV EMUL
10 mg/mL | INTRAVENOUS | Status: AC
Start: 2021-01-29 — End: ?

## 2021-01-29 MED ORDER — ROCURONIUM 50 MG/5 ML (10 MG/ML) INTRAVENOUS SYRINGE
50 mg/5 mL (10 mg/mL) | INTRAVENOUS | Status: AC
Start: 2021-01-29 — End: ?

## 2021-01-29 MED ORDER — ACETAMINOPHEN 325 MG TABLET
325 mg | Freq: Once | ORAL | Status: AC
Start: 2021-01-29 — End: 2021-01-29
  Administered 2021-01-29: 11:00:00 via ORAL

## 2021-01-29 MED ORDER — IOHEXOL 300 MG/ML IV SOLN
300 mg iodine/mL | INTRAVENOUS | Status: DC | PRN
Start: 2021-01-29 — End: 2021-01-29
  Administered 2021-01-29: 13:00:00

## 2021-01-29 MED ORDER — LACTATED RINGERS IV
INTRAVENOUS | Status: DC
Start: 2021-01-29 — End: 2021-01-29

## 2021-01-29 MED ORDER — LACTATED RINGERS IV
INTRAVENOUS | Status: DC
Start: 2021-01-29 — End: 2021-01-29
  Administered 2021-01-29 (×2): via INTRAVENOUS

## 2021-01-29 MED ORDER — ONDANSETRON (PF) 4 MG/2 ML INJECTION
4 mg/2 mL | INTRAMUSCULAR | Status: DC | PRN
Start: 2021-01-29 — End: 2021-01-29
  Administered 2021-01-29: 12:00:00 via INTRAVENOUS

## 2021-01-29 MED ORDER — LACTATED RINGERS IV
INTRAVENOUS | Status: AC | PRN
Start: 2021-01-29 — End: 2021-01-29
  Administered 2021-01-29: 13:00:00

## 2021-01-29 MED FILL — ONDANSETRON (PF) 4 MG/2 ML INJECTION: 4 mg/2 mL | INTRAMUSCULAR | Qty: 2

## 2021-01-29 MED FILL — PROPOFOL 10 MG/ML IV EMUL: 10 mg/mL | INTRAVENOUS | Qty: 100

## 2021-01-29 MED FILL — BRIDION 100 MG/ML INTRAVENOUS SOLUTION: 100 mg/mL | INTRAVENOUS | Qty: 2

## 2021-01-29 MED FILL — KETAMINE 20 MG/2 ML (10 MG/ML) IN 0.9 % SODIUM CHLORIDE IV SYRINGE: 20 mg/2 mL (10 mg/mL) | INTRAVENOUS | Qty: 2

## 2021-01-29 MED FILL — TYLENOL 325 MG TABLET: 325 mg | ORAL | Qty: 3

## 2021-01-29 MED FILL — FENTANYL CITRATE (PF) 50 MCG/ML IJ SOLN: 50 mcg/mL | INTRAMUSCULAR | Qty: 2

## 2021-01-29 MED FILL — ROCURONIUM 50 MG/5 ML (10 MG/ML) INTRAVENOUS SYRINGE: 50 mg/5 mL (10 mg/mL) | INTRAVENOUS | Qty: 5

## 2021-01-29 MED FILL — MIDAZOLAM 1 MG/ML IJ SOLN: 1 mg/mL | INTRAMUSCULAR | Qty: 2

## 2021-01-29 MED FILL — XYLOCAINE-MPF 20 MG/ML (2 %) INJECTION SOLUTION: 20 mg/mL (2 %) | INTRAMUSCULAR | Qty: 5

## 2021-01-29 MED FILL — LACTATED RINGERS IV: INTRAVENOUS | Qty: 1000

## 2021-01-29 MED FILL — HALOPERIDOL LACTATE 5 MG/ML IJ SOLN: 5 mg/mL | INTRAMUSCULAR | Qty: 1

## 2021-01-29 MED FILL — OMNIPAQUE 300 MG IODINE/ML INTRAVENOUS SOLUTION: 300 mg iodine/mL | INTRAVENOUS | Qty: 50

## 2021-01-29 MED FILL — DEXAMETHASONE SODIUM PHOSPHATE (PF) 10 MG/ML INJECTION: 10 mg/mL | INTRAMUSCULAR | Qty: 1

## 2021-01-29 MED FILL — GLYCOPYRROLATE(PF) 0.4 MG/2 ML (0.2 MG/ML) IN STERILE WATER IV SYRINGE: 0.4 mg/2 mL (0.2 mg/mL) | INTRAVENOUS | Qty: 2

## 2021-01-29 MED FILL — SENSORCAINE-MPF 0.5 % (5 MG/ML) INJECTION SOLUTION: 0.5 % (5 mg/mL) | INTRAMUSCULAR | Qty: 30

## 2021-01-29 MED FILL — DEXMEDETOMIDINE 100 MCG/ML IV SOLN: 100 mcg/mL | INTRAVENOUS | Qty: 2

## 2021-01-29 NOTE — Discharge Summary (Signed)
Physician PACU Discharge Summary     As of the last point of contact in the recovery room, the patient appeared clinically stable in all aspects.      They were discharged to the 2nd stage recovery and subsequently home.    Additional medications at the time of discharge are listed in the discharge medication reconciliation.  For discharge instructions, please see the discharge instructions include to the patient's chart.    Signed By: Arbutus Leas, PA     January 29, 2021

## 2021-01-29 NOTE — Telephone Encounter (Signed)
My chart message sent

## 2021-01-29 NOTE — Progress Notes (Signed)
Report from Rosey Bath, RN at [phone]. Pt sitting up in chair, pt's husband is out front ready to pick patient up. Pt has all personal belongings and verbalizes readiness for discharge. Pt wheeled out to [employer] and discharged with husband as escort and driver in private vehicle.

## 2021-01-29 NOTE — Telephone Encounter (Signed)
Let her know COVID neg.

## 2021-01-29 NOTE — Op Note (Signed)
Op Notes by Ova Freshwater, MD at 01/29/21 [phone]                Author: Ova Freshwater, MD  Service: SURGERY  Author Type: Physician       Filed: 01/29/21 1020  Date of Service: 01/29/21 0746  Status: Signed          Editor: Yaffa Seckman, Kym Groom, MD (Physician)               DATE OF SURGERY:   01/29/2021        PREOP DX:      Symptomatic cholelithiasis   Chronic cholecystitis      OPERATION:      Laparoscopic cholecystectomy with IOC      POSTOP DX     Same        FINDINGS:      Large distended gallbladder completley filled with multiple gallstones   Critical view of safety obtained   Cholangiogram did now show defects in the CBD or CHD   Given the size of the specimen I had to elongate the subxiphoid incision and fascia to exteriorize the specimen   Liver with yellow coloration suggestive of hepatic steatosis      SURGEON      Madilyn Hook, MD      ASSISTANT:   Valerie Roys, PA-C      ANESTHESIOLOGIST     M. Louann Sjogren, MD      TECHNIQUE      GET      EBL       15 ml      SPECIMEN      Gallbladder and contents      DRAINS       None      COMPLICATIONS     No immediate complications      INDICATIONS   69yo F w/ PMHx significant for obesity class 1, HTN, hepatic steatosis, hysterectomy       Patient consulted [facility] on 01/02/21 due to complaints of  severe right flank pain with some nausea. This was also noted to be worsened with food intake. The pain radiated to the back. Described as sharp and colicky. She thought was passing a kidney stone. A CT A/P showed  Cholelithiasis with a mildly distended gallbladder. No significant   pericholecystic inflammatory changes. Significant images reviewed by me below                The patient had persistent symptoms and thus an outpatient RUQ ultrasound was performed on 01/14/21 [facility]) showing a gallbladder filled with stones. No pericholecystic fluid  is seen. No intrahepatic or extrahepatic biliary  ductal dilation.                The patient tells me this is affecting her quality of life. She has been very cautious about what she eats since this makes the pain return. Has been nauseous but without emesis. The patient presents  with symptomatic cholelithiasis with recurrent biliary attack. She has evidence of gallbladder disease both on her CT scan and RUQ ultrasound. Her most recent LFTs do not show an obstructive pattern but she has significant stone burden. I have discussed  with the patient my recommendations for lap cholecystectomy with IOC.       I discussed with patient risks and benefits of laparoscopic cholecystectomy + IOC. Risks including, but not limited to bleeding, infection, bile leak, injury to bile ducts,  bowel, liver, blood vessels,  nerves and surrounding organs. Need to convert to open operation, DVT, PE, death among others. Patient expressed understanding and wishes to proceed with planned operation          DESCRIPTION:   Patient was met in the holding area. Informed consent was verified. All questions were answered. Patient was transferred to OR#5.  Patient was placed supine in the OR table. Prior to  induction, all pressure points were padded, and the patient was secured to the operating table using a leg strap.   SCDs were applied to both lower extremities, IV antibiotics were given.  General endotracheal intubation was performed uneventfully. The  abdomen was then prepped and draped in the standard surgical fashion.  A final time out to verify that the patient's name, MRN, procedure to be performed, special equipment or implants to be used, need for antibiotics, allergies and identification of  OR staff.      An orogastric tube was introduced to decompress the stomach. A Verees needle was introduced in the LUQ and after obtaining negative aspiration and positive saline drop test the abdomen  was insufflated with CO2 to 15 mmHg.  A 5 mm optiport trocar was placed in the periumbilical  area and the abdomen was entered under direct visualization free of injury. Additional trocars were placed including a 12 mm subxiphoid and two 5 mm RUQ trocars.  I verified the Verees needle insertion site was free of injury as well.      The patient was placed in reverse Trendelenburg position with the right side elevated. Initial diagnostic laparoscopy was consistent with a fatty liver. The gallbladder was then identified, this was quite large, distended completley by multiple gallstones  which could easily identified just on observation. I attempted to aspirate the gallbladder but there was minimal bile within it. The gallbladder also appeared to be chronically inflamed suggestive of chronic cholecystitis.       The fundus of the gallbladder was grasped by my assistant and directed towards the right shoulder of the patient and the infundibulum was also grasped and retracted laterally.  The peritoneum around the gallbladder was dissected and taken down bluntly  until the cystic duct was visualized.  The peritoneum on the posterior side of the gallbladder was also dissected free. The peritoneum was thick and highly suggestive of chronic cholecystitis. The triangle of Calot was eventually skeletonized until a  critical view was obtained, the cystic duct was identified coming directly to the neck of the gallbladder, a solitary cystic artery coming directly off the gallbladder was also identified during dissection. The critical view of safety was confirmed by  the assistant as well. A photograph was taken to document this critical step.       A clip was placed on the proximal cystic duct next to the neck of the gallbladder, cystic duct was partially transected and an intraoperative cholangiogram was obtained. The cystic duct filled the common bile duct and common hepatic ducts. No filling  defects were noted. There was free flow of contrast into the duodenum. Subsequently 2 additional clips were placed on the distal  cystic duct.  Two clips were placed on the proximal cystic artery, and one distally next to the gallbladder.  Both the cystic  duct and cystic artery were then divided with Endo shears.  At this point, the gallbladder was taken off the liver bed using electrocautery taking care to not get into the liver.  Once off the liver, the  gallbladder was placed into an EndoCatch bag under  direct visualization. The gallbladder was quite large and with hundreds of stones. It was clear it was not possible to remove this via the 12 mm subxiphoid port. I removed this trocar and made the incision larger, approximately 2 inches. I then went down  to the fascia and opened it up with the Bovie monopolar energy until the bag containing the gallbladder was removed and passed off the table as a specimen. I then closed the fascial defect of the subxiphoid port with a running 0-Vicryl suture until no  remaining fascial defect was identified. I then went back laparoscopically and confirmed hemostasis in the gallbladder fossa. We were able to see the clips on the cystic artery, which were in proper place, as well as the clips on the cystic duct, which  were in proper place as well.  No further bleeding and/or drainage of bile was noted within the gallbladder fossa.       The right upper quadrant was irrigated and suctioned until dry.  We then proceeded under direct visualization to remove the trocars.  No further bleeding was noted. The incisions were irrigated, then reapproximated using a 4-0 subcuticular Monocryl.   The skin was then reinforced with surgical glue.        The patient tolerated the procedure well, was extubated in the operating room and transported to the PACU in stable condition.  Needle, sponge, and instrument counts were reported as correct at the end of the case.      The first assistant helped with various portions of this procedure, under my direct supervision, which included laparoscopic camera navigation,  retraction, exposure, and trocar placement.  This was done within the scope of their practice and under their  plan of supervision.  In addition, the assistant completed wound closure with me immediately available.       PHOTO 1 - LIVER / GALLBLADDER               PHOTO 2 - GALLBLADDER (ELEVATED) WITH MULTIPLE STONES                PHOTO 3 - CRITICAL VIEW OF SAFETY               PHOTO 4 - INTRAOPERATIVE CHOLANGIOGRAM              PHOTO 5 - SPECIMEN                 Signed By:  Ova Freshwater, MD           January 29, 2021

## 2021-01-29 NOTE — Anesthesia Post-Procedure Evaluation (Signed)
Procedure(s):  LAPAROSCOPIC CHOLECYSTECTOMY WITH INTRAOP CHOLANGIOGRAM.    general    Anesthesia Post Evaluation      Multimodal analgesia: multimodal analgesia used between 6 hours prior to anesthesia start to PACU discharge  Patient location during evaluation: bedside  Patient participation: complete - patient participated  Level of consciousness: awake  Pain management: adequate  Airway patency: patent  Anesthetic complications: no  Cardiovascular status: acceptable  Respiratory status: acceptable  Hydration status: acceptable  Post anesthesia nausea and vomiting:  none  Final Post Anesthesia Temperature Assessment:  Normothermia (36.0-37.5 degrees C)      INITIAL Post-op Vital signs:   Vitals Value Taken Time   BP 146/79 01/29/21 1228   Temp 36 ??C (96.8 ??F) 01/29/21 1228   Pulse 73 01/29/21 1228   Resp 16 01/29/21 1228   SpO2 93 % 01/29/21 1230   Vitals shown include unvalidated device data.

## 2021-01-30 NOTE — Telephone Encounter (Signed)
I spoke with patient and reiterated her discharge instructions as written on 01/29/2021.   Patient states her pain is controlled by Tylenol and Ibuprofen mostly but she did take one Norco this morning other wise no issues or concerns at this time.  Signed By: Orland Mustard, CMA     January 30, 2021

## 2021-01-30 NOTE — Telephone Encounter (Signed)
Telephone Encounter by Orland Mustard, CMA at 01/30/21 1121                Author: Orland Mustard, CMA  Service: --  Author Type: Medical Assistant       Filed: 01/30/21 1121  Encounter Date: 01/30/2021  Status: Signed          Editor: Orland Mustard, CMA (Medical Assistant)          From: Leonia Reader FarringtonTo: Zetta Bills GomezValencia, MDSent: 01/30/2021 11:03 AM EDTSubject: LiftingThe doctor told my husband I was to do no lifting  for one month. Is there a weight limit or just a flat no lifting?Thank Loel Lofty

## 2021-02-02 ENCOUNTER — Encounter: Attending: Family Medicine | Primary: Family Medicine

## 2021-02-05 NOTE — Telephone Encounter (Signed)
Telephone Encounter by Kellie Simmering, MD at 02/05/21 3614                Author: Kellie Simmering, MD  Service: --  Author Type: Physician       Filed: 02/05/21 0837  Encounter Date: 02/05/2021  Status: Signed          Editor: Marilu Rylander, Zetta Bills, MD (Physician)               TELEPHONIC PATHOLOGY REPORT      Patient states she is doing well. No pain, no nausea, no emesis. Having normal bowel function. Denies issues with her incisions. She was updated about her pathology report as below. All questions answered.  I will see her for follow up next week.       DATE OF SURGERY:   01/29/2021                              PREOP DX:                               Symptomatic cholelithiasis   Chronic cholecystitis       OPERATION:                            Laparoscopic cholecystectomy with IOC       FINDINGS:                                 Large distended gallbladder completley filled with multiple gallstones   Critical view of safety obtained   Cholangiogram did now show defects in the CBD or CHD   Given the size of the specimen I had to elongate the subxiphoid incision and fascia to exteriorize the specimen   Liver with yellow coloration suggestive of hepatic steatosis          PATHOLOGY: Gallbladder and contents, laparoscopic cholecystectomy:   - Gallbladder with chronic calculus cholecystitis and focal cytologic atypia, favor reactive.      Comment: Intradepartmental second pathologist consultation performed.         PHOTO 1 - LIVER / GALLBLADDER                PHOTO 2 - GALLBLADDER (ELEVATED) WITH MULTIPLE STONES                  PHOTO 3 - CRITICAL VIEW OF SAFETY                 PHOTO 4 - INTRAOPERATIVE CHOLANGIOGRAM                PHOTO 5 - SPECIMEN                   Signed By:  Kellie Simmering, MD           February 05, 2021

## 2021-02-12 ENCOUNTER — Ambulatory Visit: Attending: Surgery | Primary: Family Medicine

## 2021-02-12 ENCOUNTER — Ambulatory Visit: Admit: 2021-02-12 | Discharge: 2021-02-12 | Payer: MEDICARE | Attending: Surgery | Primary: Family Medicine

## 2021-02-12 DIAGNOSIS — Z9049 Acquired absence of other specified parts of digestive tract: Secondary | ICD-10-CM

## 2021-02-12 NOTE — Progress Notes (Signed)
Progress Notes by Kellie Simmering, MD at 02/12/21 0900                Author: Kellie Simmering, MD  Service: --  Author Type: Physician       Filed: 02/12/21 0918  Encounter Date: 02/12/2021  Status: Signed          Editor: Ardella Chhim, Zetta Bills, MD (Physician)               General Surgery - Surgery Clinic Follow Up Note      CC:   Postop follow up #1      DATE OF SURGERY:   01/29/2021                              PREOP DX:                               Symptomatic cholelithiasis   Chronic cholecystitis       OPERATION:                            Laparoscopic cholecystectomy with IOC       FINDINGS:                                 Large distended gallbladder completley filled with multiple gallstones   Critical view of safety obtained   Cholangiogram did now show defects in the CBD or CHD   Given the size of the specimen I had to elongate the subxiphoid incision and fascia to exteriorize the specimen   Liver with yellow coloration suggestive of hepatic steatosis           PATHOLOGY: Gallbladder and contents, laparoscopic cholecystectomy:   - Gallbladder with chronic calculus cholecystitis and focal cytologic atypia, favor reactive.       Comment: Intradepartmental second pathologist consultation performed.           PHOTO 1 - LIVER / GALLBLADDER                PHOTO 2 - GALLBLADDER (ELEVATED) WITH MULTIPLE STONES                  PHOTO 3 - CRITICAL VIEW OF SAFETY                 PHOTO 4 - INTRAOPERATIVE CHOLANGIOGRAM      The images reveal postsurgical changes of cholecystectomy with cannulation and opacification of the cystic duct stump with further opacification of the common duct.  No discrete filling defects  or strictures are noted in the common duct.                PHOTO 5 - SPECIMEN                      SUBJECTIVE:   Patient here for follow up   She tells me is doing quite well   No longer affected by her RUQ pain when she eats   Normal bowel function   No jaundice   Normal urine and  stool color   Satisfied with surgical outcomes         OBJECTIVE:      Visit Vitals      BP  134/78 (BP  1 Location: Left upper arm, BP Patient Position: Sitting, BP Cuff Size: Large adult)        Pulse  84     Temp  97.9 ??F (36.6 ??C) (Temporal)     Ht  5\' 5"  (1.651 m)     Wt  174 lb (78.9 kg)     SpO2  99%        BMI  28.96 kg/m??              PHYSICAL EXAM:      General: Alert and Oriented x 4      Lung:  No respiratory distress      Heart:  Regular rate       Abdomen:     Soft, not tender, all incisions healing well, no evident hernias                  ASSESSMENT/PLAN:   Patient doing well from the surgical perspective   Discussed pathology report   Provided dietary instructions / Alarm signs given   All questions answered   Follow up with me PRN         , MD   02/12/21

## 2021-02-26 ENCOUNTER — Ambulatory Visit: Attending: Family Medicine | Primary: Family Medicine

## 2021-02-26 ENCOUNTER — Ambulatory Visit: Admit: 2021-02-26 | Discharge: 2021-02-26 | Payer: MEDICARE | Attending: Family Medicine | Primary: Family Medicine

## 2021-02-26 DIAGNOSIS — Z0000 Encounter for general adult medical examination without abnormal findings: Secondary | ICD-10-CM

## 2021-02-26 DIAGNOSIS — Z23 Encounter for immunization: Secondary | ICD-10-CM

## 2021-02-26 NOTE — Progress Notes (Signed)
Immunization/s administered 02/26/2021 by Lynne Leader Merrithew, CMA with consent.  Patient tolerated procedure well.  No reactions noted.      A time out was performed checking for correct patient; correct vaccine or diluents; correct time (includes administering correct age, appropriate interval and before vaccine or diluent expires); correct dosage; correct route, needle length and technique; correct site and correct documentation.  It was verified that the patient is not allergic to the vaccine and/or medication being given. The patient is not currently moderately or severely ill.      A second time out was done by Cleda Mccreedy, CMA. See additional note for 2nd time out documentation.     VIS sheet given

## 2021-02-26 NOTE — Progress Notes (Signed)
[facility]   [address]  [place] [address]  [phone]    SUBSEQUENT MEDICARE [place] VISIT       CHIEF COMPLAINT     Katherine Osborne, 70 y.o. female, presents for Annual Wellness Visit and Complete Physical.     HPI      Preventive  Colon in 2020 - 5 year recall  Sees GYN - due for mam in December  Hx of osteopenia by DEXA    Overall feels well, losing weight; recently s/p lap chole    ROS: No TIA's or dysphagia. No prolonged cough. No dyspnea or chest pain on exertion.  No abdominal pain, change in bowel habits, black or bloody stools.  No urinary tract symptoms. She is post menopausal. No hot flashes, abnormal vaginal bleeding, discharge or unexpected pelvic pain. No new breast lumps, breast pain or nipple discharge.        OBJECTIVE   Visit Vitals  BP 114/62 (BP 1 Location: Left upper arm, BP Patient Position: Sitting, BP Cuff Size: Adult)   Ht 5' 5"  (1.651 m)   Wt 173 lb (78.5 kg)   BMI 28.79 kg/m??   I have reviewed/discussed the above normal BMI with the patient.  I have recommended the following interventions: dietary management education, guidance, and counseling, encourage exercise, and monitor weight . Marland Kitchen        BP Readings from Last 3 Encounters:   02/26/21 114/62   02/12/21 134/78   01/29/21 121/66      Wt Readings from Last 3 Encounters:   02/26/21 173 lb (78.5 kg)   02/12/21 174 lb (78.9 kg)   01/29/21 174 lb 9.7 oz (79.2 kg)     Physical Exam  Constitutional:       Appearance: Normal appearance.   HENT:      Head: Normocephalic and atraumatic.      Right Ear: Tympanic membrane normal.      Left Ear: Tympanic membrane normal.   Eyes:      General:         Right eye: No discharge.         Left eye: No discharge.      Extraocular Movements: Extraocular movements intact.      Conjunctiva/sclera: Conjunctivae normal.      Pupils: Pupils are equal, round, and reactive to light.   Cardiovascular:      Rate and Rhythm: Normal rate and regular rhythm.       Pulses: Normal pulses.      Heart sounds: Normal heart sounds.   Pulmonary:      Effort: Pulmonary effort is normal.      Breath sounds: Normal breath sounds.   Abdominal:      General: Bowel sounds are normal.      Palpations: Abdomen is soft.   Musculoskeletal:      Right lower leg: No edema.      Left lower leg: No edema.   Skin:     General: Skin is warm and dry.      Capillary Refill: Capillary refill takes less than 2 seconds.   Neurological:      General: No focal deficit present.      Mental Status: She is alert and oriented to person, place, and time.   Psychiatric:         Mood and Affect: Mood normal.         Behavior: Behavior normal.  ASSESSMENT AND PLAN     Diagnoses and all orders for this visit:    1. Medicare annual wellness visit, subsequent    2. Encounter for immunization  -     ADMIN PNEUMOCOCCAL VACCINE  -     PNEUMOCOCCAL, PCV20, PREVNAR 20, (AGE 90 YRS+), IM, PF     Preventive  UTD on cancer screenings.  Keep follow-up with GYN.  Prevnar20 today.  WELLNESS EVALUATION & HEALTH RISK ASSESSMENT     DEPRESSION SCREENING  3 most recent PHQ Screens 02/26/2021   Little interest or pleasure in doing things Not at all   Feeling down, depressed, irritable, or hopeless Not at all   Total Score PHQ 2 0          FALL RISK SCREENING  Fall Risk Assessment, last 12 mths 02/26/2021   Able to walk? Yes   Fall in past 12 months? 0   Do you feel unsteady? 0   Are you worried about falling 0   Is TUG test greater than 12 seconds? -   Is the gait abnormal? -   Number of falls in past 12 months -   Fall with injury? -       GENERAL  What matters most to you?: Fatty liver  In general, how would you say your health is?: Good  Do you have a Living Will?: Yes    HEALTH HABITS AND NUTRITION  Do you worry whether your food will run out before you have money to buy more?: No  Have you lost any weight without trying in the past 3 months?: No  Have you a dentist within the past year?: Yes    HEARING/VISION/SKIN  Do you  or your family notice any trouble with your hearing?: No  Do you have difficulty driving, watching TV, or doing any of your daily activities because of your eyesight?: No  Do you have any skin concerns?: (!) Yes (sees Dr. Lissa Merlin)    SLEEP/MEMORY/ANXIETY  Do you have concerns about your sleep?: No  Do you often feel tired, fatigued, or sleepy during the daytime, even after a "good" night's sleep? : No  Do you or a family member have concerns about your memory?: No  Over the last several months, have you been continually worried or anxious about a number of events or activities in your daily life? : No    SUBSTANCE AND OPIOID USE  Do you currently drink alcohol?: Yes  On average, how many days per week do you drink alcohol?: Less than 1  In the past year, have you used street drugs or prescription medications not prescribed to you?: No  Are you currently using a prescription opioid pain medication such as oxycodone, tramadol, hydrocodone or morphine?: No    SAFETY  Does your home have throw rugs, poor lighting, a slippery bathtub or shower, or clutter in the hallways?: No  Do all of your stairways have a railing or banister?: Yes  Do you have difficulty with balance or walking?: No    ADLS  In the past 7 days, did you need help from others to perform any of the following everyday activities: eating dressing, grooming, bathing, toileting, walking/balance?: No  In the past 7 days, did you need help from others to take care of laundry, housekeeping, banking or paying bills, shopping, telephone use, food preparation, transportation or taking medications?: (!) Laundry, Housekeeping    PHYSICAL ACTIVITY  Are You Physically Active: Yes  Do You Have  Difficulty Exercising: No  What Type of Activity Do You Engage In: Other  How Many Days Are You Active Per Week : 3-5  How Many Average Minutes of Activity Per Day: 20-30 Minutes     TIMED UP AND GO TEST (If indicated)       MINI-COG SCORE (If indicated)       STOP-BANG SCORE (If  indicated)         PREVENTIVE CARE -SCREENINGS      Health Maintenance Topics with due status: Overdue       Topic Date Due    COVID-19 Vaccine 09/21/2020     Health Maintenance Topics with due status: Due On       Topic Date Due    Depression Screen 01/31/2021     Health Maintenance Topics with due status: Not Due       Topic Last Completion Date    DTaP/Tdap/Td series 05/11/2017    Colorectal Cancer Screening Combo 03/07/2019    Flu Vaccine 04/29/2019    Breast Cancer Screen Mammogram 06/12/2019    Lipid Screen 01/13/2021    Medicare Yearly Exam 02/26/2021     Health Maintenance Topics with due status: Completed       Topic Last Completion Date    Hepatitis C Screening 11/29/2014    Shingrix Vaccine Age 54> 08/05/2017    Bone Densitometry (Dexa) Screening 06/12/2019    Pneumococcal 65+ years 02/26/2021         Colon cancer:  Recommendation: Colonoscopy every 10y or annual FIT test from 50-75 or every 3 year stool DNA based test with consideration of ongoing screening from 76-85. and Up to date or Completed    Lung cancer (LDCT): Recommendation: Yearly LDCT for pts 55-77 w 30-pack year hx and currently smoke or quit <15 yr ago. and Not Indicated    Hepatitis C:  Recommendation: One time screening for all patient's aged 18-79. and Not Indicated    Diabetes:   Recommendation: USPSTF recommends screening ages 51-70 y/o if overweight or obese. Medicare covers screening in those patients who are overweight, obese, have HTN or dyslipiemia, a personal history of gestational diabetes or prior elevated blood sugar, a family hx of DM, or any patient over age 68 and Not Indicated    Lipids:   Recommendation: screening for hyperlipidemia every 5 years after age 58 and Up to date or Completed    PREVENTIVE CARE - FEMALE SCREENINGS     Cervical cancer: Recommendation: Every 3 yr from 21-29 and every 5 yr from 73-65, with Pap and HPV testing. and Not Indicated    Breast cancer: Recommendation:  USPSTF recommends screening  mammography every 2 yr from 50-74. The decision to start screening mammography in women prior to age 4 years should be an individual one. Women who place a higher value on the potential benefit than the potential harms may choose to begin biennial screening between the ages of 69 and 27 years. Medicare covers annual screening mammography, USPSTF also recommends women with a personal or family history of breast, ovarian, tubal, or peritoneal cancer or who have an ancestry (Ashkenazi Jewish) associated with breast cancer susceptibility 1 and 2 (BRCA1/2) gene mutations with an appropriate brief familial risk assessment tool. Women with a positive result on the risk assessment tool should receive genetic counseling and, if indicated after counseling, genetic testing, and mammogram is up to date or completed    Osteoporosis: Recommendation: Screen all women at 16, earlier if elevated risk and  Up to date or Completed    AAA:   Recommendation: One-time screening if family history of AAA and Not Indicated    IMMUNIZATIONS     Immunization History   Administered Date(s) Administered    COVID-19, MODERNA BLUE border, Primary or Immunocompromised, (age 18y+), IM, 100 mcg/0.57m 09/08/2019, 10/06/2019    COVID-19, MODERNA Booster BLUE border, (age 18y+), IM, 544m/0.25mL 05/24/2020    Influenza Vaccine 04/28/2019    Influenza, FLUAD, (age 69+), Adjuvanted 04/29/2019    Pneumococcal Conjugate PCV20, PF (Prevnar 20) 02/26/2021    Pneumococcal Polysaccharide (PPSV-23) 05/18/2018    Tdap 05/11/2017    Zoster Recombinant 05/23/2017, 08/05/2017       Pneumovax:   Recommendation: PPSV23 once for all >65 and high risk <65     Prevnar:   Recommendation: PCV13 only if >65 and immunocompromised or residing in a nursing home, or in areas of low childhood Pneumococcal vaccination and prevnar20 today    Influenza:   Recommendation: Vaccination annually, high dose if 65 or older and Up to date or Completed    Shingrix:  Recommendation:  Vaccination 2 shots 2-6 months apart for all age >5>33nd Up to date or Completed    TDaP:    Recommendation: [employer], operationalith TDaP every 10 yr. and Up to date or Completed      INDIVIDUALIZED SCREENING, EDUCATION, AND PLAN     The patient and any caregiver(s) were counseled on: Healthcare maintenance and preventive items as above.    The patient is not on high risk medication(s) including benzodiaepines.    Based on my evaluation of the patient and the Health Risk Assessment performed today, there is not evidence of cognitive impairment.      Medications, allergies, problem list, previous encounters, recent results, medical, social and family history reviewed in the electronic health record. Medications and problems are viewable in the Encounter tab for this visit.    Current Outpatient Medications   Medication Sig    CALCIUM CITRATE PO Take 1 Tablet by mouth two (2) times a day. 600 mg tabs    hydroCHLOROthiazide (HYDRODIURIL) 25 mg tablet Take 1 Tablet by mouth daily.    losartan (COZAAR) 25 mg tablet Take 1 Tablet by mouth daily.    fluticasone propionate (FLONASE) 50 mcg/actuation nasal spray 1 Spray by Both Nostrils route daily as needed for Allergies (uses in the spring).    antiox #8/om3/dha/epa/lut/zeax (PRESERVISION AREDS 2, OMEGA-3, PO) Take 1 Tablet by mouth daily.    red yeast rice extract 600 mg cap Take 1,200 mg by mouth daily.    triamcinolone acetonide (KENALOG) 0.1 % topical cream Apply 1 oz to affected area as needed for Skin Irritation. use thin layer    cholecalciferol (VITAMIN D3) (2,000 UNITS /50 MCG) cap capsule Take 2,000 Units by mouth in the morning.     No current facility-administered medications for this visit.     There are no discontinued medications.  Allergies   Allergen Reactions    Adhesive Rash     Bandaids    Amoxicillin Rash    Clindamycin Diarrhea     Past Medical History:   Diagnosis Date    Back pain     Bilateral cataracts     [place]    melanoma removed from  arm x2 per hx    Contusion of left breast     Diverticulitis     Epiretinal membrane     Bilateral    H/O  seasonal allergies     Hyperlipidemia     Hypertension     Hypertensive disorder     Hypertensive retinopathy of both eyes     Hypokalemia     Ill-defined condition     1973 mono with jaundice    Ill-defined condition     May 2022, Covid+ mild case    Kidney stone     Liver disease     fatty liver    Macular drusen, bilateral     Nuclear senile cataract of both eyes     Osteopenia     Palpitations     PUD (peptic ulcer disease)     ? of over 20 yrs ago    Submandibular lymphadenopathy     Vitreous floaters      Past Surgical History:   Procedure Laterality Date    HX COLONOSCOPY  03/07/2019    [facility] - Colonoscopy diverticulosis, polyp Daisy Floro     HX CYST REMOVAL  07/05/1981    pilonidal cyst    HX HYSTERECTOMY  2002    prolaped uterus fibroid    HX LAP CHOLECYSTECTOMY  01/29/2021    Laparoscopic cholecystectomy - Conception Oms MD    HX OOPHORECTOMY Bilateral 2002    prolapsed uterus    HX WISDOM TEETH EXTRACTION       Family History   Problem Relation Age of Onset    Heart Disease Mother     Diabetes Mother     Cataract Mother         Bilateral    Breast Cancer Mother         malignant tumor    Heart Disease Father     Diabetes Father     Cataract Father         Bilateral    Macular Degen Father     Prostate Cancer Father         malignant    Colon Cancer Paternal Grandfather     Breast Cancer Maternal Aunt         50's with recurrance    Diabetes Other         Other family hx of    Other Other         Grandmother - malignant tumor of colon, Grandfather - Bilateral degeneration of macula     Social History     Socioeconomic History    Marital status: MARRIED   Tobacco Use    Smoking status: Never     Passive exposure: Never    Smokeless tobacco: Never   Vaping Use    Vaping Use: Never used   Substance and Sexual Activity    Alcohol use: Not Currently    Drug use: Never   Social  History Narrative    Married    Two Chldren    Retired Pharmacist, 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       Future Appointments   Date Time Provider Thomas   03/01/2022  9:15 AM Erven Colla, MD [facility]  Erven Colla, MD, 02/26/2021  This encounter has been electronically signed

## 2021-03-26 ENCOUNTER — Encounter

## 2021-04-04 ENCOUNTER — Encounter

## 2021-05-07 ENCOUNTER — Encounter

## 2021-06-01 ENCOUNTER — Other Ambulatory Visit: Payer: Self-pay | Admitting: Obstetrics and Gynecology

## 2021-06-01 DIAGNOSIS — N6320 Unspecified lump in the left breast, unspecified quadrant: Secondary | ICD-10-CM

## 2021-06-16 MED ORDER — LOSARTAN POTASSIUM 25 MG PO TABS
25 MG | ORAL_TABLET | Freq: Every day | ORAL | 3 refills | Status: AC
Start: 2021-06-16 — End: 2022-03-01

## 2021-06-16 NOTE — Telephone Encounter (Signed)
Katherine Osborne  31-May-1951      Call back needed: No    Preferred call back number:   Home Phone [phone] (home)    Medications Requested:  Requested Prescriptions     Pending Prescriptions Disp Refills    losartan (COZAAR) 25 MG tablet 90 tablet 3     Sig: Take 1 tablet by mouth daily       Preferred Pharmacy:   [facility] - [place], [place] - [address] - P [phone] - F [phone]  [address]  [place] [address]  Phone: [phone] Fax: [phone]      Other Instructions:

## 2021-06-19 ENCOUNTER — Inpatient Hospital Stay: Admit: 2021-06-19 | Payer: MEDICARE | Primary: Family Medicine

## 2021-06-19 DIAGNOSIS — Z780 Asymptomatic menopausal state: Secondary | ICD-10-CM

## 2021-06-19 DIAGNOSIS — M8580 Other specified disorders of bone density and structure, unspecified site: Secondary | ICD-10-CM

## 2021-06-19 DIAGNOSIS — Z1231 Encounter for screening mammogram for malignant neoplasm of breast: Secondary | ICD-10-CM

## 2021-07-08 ENCOUNTER — Other Ambulatory Visit: Payer: Self-pay

## 2021-07-09 ENCOUNTER — Other Ambulatory Visit: Payer: Self-pay

## 2021-07-16 ENCOUNTER — Ambulatory Visit
Admission: RE | Admit: 2021-07-16 | Discharge: 2021-07-16 | Disposition: A | Discharge status: Home / Self Care | Payer: Medicare Other | Source: Ambulatory Visit | Attending: Obstetrics and Gynecology | Admitting: Obstetrics and Gynecology

## 2021-07-16 ENCOUNTER — Other Ambulatory Visit: Payer: Self-pay | Admitting: Obstetrics and Gynecology

## 2021-07-16 ENCOUNTER — Other Ambulatory Visit: Payer: Self-pay

## 2021-07-16 DIAGNOSIS — N6320 Unspecified lump in the left breast, unspecified quadrant: Secondary | ICD-10-CM

## 2021-07-16 DIAGNOSIS — N644 Mastodynia: Secondary | ICD-10-CM

## 2021-07-16 DIAGNOSIS — N6042 Mammary duct ectasia of left breast: Secondary | ICD-10-CM

## 2021-07-21 ENCOUNTER — Ambulatory Visit
Admission: RE | Admit: 2021-07-21 | Discharge: 2021-07-21 | Disposition: A | Discharge status: Home / Self Care | Payer: Medicare Other | Source: Ambulatory Visit | Attending: Obstetrics and Gynecology | Admitting: Obstetrics and Gynecology

## 2021-07-21 DIAGNOSIS — N644 Mastodynia: Secondary | ICD-10-CM

## 2021-07-21 DIAGNOSIS — N6042 Mammary duct ectasia of left breast: Secondary | ICD-10-CM

## 2021-07-27 ENCOUNTER — Other Ambulatory Visit: Payer: Medicare Other

## 2021-07-31 NOTE — Telephone Encounter (Signed)
Patient is calling to ask if the Dr had any recommendations on an accurate smart watch that can read her blood pressures. She's says she invested in a smart watch and it is not to read correctly. Please advise patient [phone]

## 2021-08-02 MED ORDER — NIRMATRELVIR&RITONAVIR 300/100 20 X 150 MG & 10 X 100MG PO TBPK
2015010100 x 150 MG & 10 x 100MG | ORAL_TABLET | ORAL | 0 refills | Status: DC
Start: 2021-08-02 — End: 2022-03-01

## 2021-08-02 NOTE — Progress Notes (Signed)
On call note. Onset of Covid symptoms yesterday, tested positive today, would like to use paxlovid. Went through same process last May. No concerning meds. Risks include age>65, obesity. Last gift 01/2021 >60

## 2021-08-03 NOTE — Telephone Encounter (Signed)
Called pt and she noted name ofd recommended home machine, she states that old machine constantly read 140/80. She expressed understanding with no further questions

## 2021-08-03 NOTE — Telephone Encounter (Signed)
I'm not sure of any smart watches doing BP well right now.  [employer] tends to make the nicest home BP machines.  They have a smartwatch that does it, but I don't have any experience with how accurate it is.  We can always test her machine to the wall cuffs in office to see how accurate they are.

## 2021-08-03 NOTE — Telephone Encounter (Signed)
The following message was received on the overnight call log on 08/02/21 at 8:06 AM    Caller: Lupita Leash [phone]    COVID+, seeking Paxlovid

## 2021-08-03 NOTE — Telephone Encounter (Signed)
Appears Paxlovid was prescribed by on-call provider Dr. Dorthula Rue.    No further action needed at this time.

## 2021-08-03 NOTE — Telephone Encounter (Signed)
Looks like Dr. Dorthula Rue sent in the paxlovid yesterday when the pt called oncall.

## 2021-08-03 NOTE — Telephone Encounter (Signed)
FYI

## 2021-08-04 NOTE — Telephone Encounter (Signed)
Medications Requested:  Requested Prescriptions     Pending Prescriptions Disp Refills    hydroCHLOROthiazide (HYDRODIURIL) 25 MG tablet 30 tablet      Sig: Take 1 tablet by mouth daily       Preferred Pharmacy:   [facility] - [place], [place] - [address] - P [phone] - F [phone]  [address]  [place] [address]  Phone: [phone] Fax: [phone]      Prescription Refill Protocol reviewed: Yes    Diuretics Protocol Failed 08/04/2021 01:56 PM   Protocol Details  Last creatinine level resulted within the past 12 months    Last potassium level normal, within the past 12 months    Last sodium level normal, within the past 12 months    Visit with authorizing provider in past 9 months or upcoming 90 days         Allergy List Reviewed and Verified: Yes    Possible medication to medication interactions reviewed: Yes    Last appt @ PCP Office: 02/26/2021     Future Appointments   Date Time Provider Department Center   03/01/2022  9:15 AM Amparo Bristol, MD [facility]       MOST RECENT BLOOD PRESSURES  BP Readings from Last 3 Encounters:   02/26/21 114/62   02/12/21 134/78   01/22/21 (!) 140/72         MOST RECENT LAB DATA  Lab Results   Component Value Date/Time    CREATEXT 0.85 07/13/2019 12:00 AM    POTEXT 3.4 07/13/2019 12:00 AM

## 2021-08-04 NOTE — Telephone Encounter (Signed)
ANALIAH DRUM  April 02, 1951      Call back needed: No    Preferred call back number: Home phone   [phone] (home)    Telephone Information:   Mobile [phone]        Medications Requested:  Requested Prescriptions     Pending Prescriptions Disp Refills    hydroCHLOROthiazide (HYDRODIURIL) 25 MG tablet 30 tablet      Sig: Take 1 tablet by mouth daily       Preferred Pharmacy:   [facility] - [place], [place] - [address] - P [phone] - F [phone]  [address]  [place] [address]  Phone: [phone] Fax: [phone]

## 2021-08-04 NOTE — Telephone Encounter (Signed)
Red allergy/contraindication warning comes up, so per new refill protocol unable to send. Please review and sign if appropriate.

## 2021-08-05 MED ORDER — HYDROCHLOROTHIAZIDE 25 MG PO TABS
25 MG | ORAL_TABLET | Freq: Every day | ORAL | 3 refills | Status: AC
Start: 2021-08-05 — End: 2022-03-01

## 2021-08-12 NOTE — Telephone Encounter (Signed)
This was sent

## 2022-01-26 ENCOUNTER — Telehealth

## 2022-01-26 NOTE — Telephone Encounter (Signed)
Pt has PE on 8/28. She would like pcp to order any labs so she can get them done before her appt. Please call pt to advise

## 2022-01-26 NOTE — Telephone Encounter (Signed)
CMP, lipid panel, Vit D, A1c please.

## 2022-01-26 NOTE — Telephone Encounter (Signed)
What labs would you like pt to complete?

## 2022-01-27 NOTE — Telephone Encounter (Signed)
I left a message letting pt know that labs have been ordered.

## 2022-02-22 ENCOUNTER — Encounter

## 2022-02-24 ENCOUNTER — Encounter

## 2022-03-01 ENCOUNTER — Ambulatory Visit: Admit: 2022-03-01 | Discharge: 2022-03-01 | Payer: MEDICARE | Attending: Family Medicine | Primary: Family Medicine

## 2022-03-01 ENCOUNTER — Encounter: Attending: Family Medicine | Primary: Family Medicine

## 2022-03-01 DIAGNOSIS — Z0000 Encounter for general adult medical examination without abnormal findings: Secondary | ICD-10-CM

## 2022-03-01 MED ORDER — LOSARTAN POTASSIUM 25 MG PO TABS
25 MG | ORAL_TABLET | Freq: Every day | ORAL | 3 refills | Status: AC
Start: 2022-03-01 — End: ?

## 2022-03-01 MED ORDER — HYDROCHLOROTHIAZIDE 25 MG PO TABS
25 MG | ORAL_TABLET | Freq: Every day | ORAL | 3 refills | Status: AC
Start: 2022-03-01 — End: ?

## 2022-03-01 NOTE — Patient Instructions (Signed)
Continue doing what you're doing  Refills sent  Ordered mam for December

## 2022-03-01 NOTE — Progress Notes (Signed)
[facility]   [address]  [place] [address]  [phone]    SUBSEQUENT MEDICARE Katherine Osborne VISIT       CHIEF COMPLAINT     Katherine Osborne, 71 y.o. female , presents for AWV    HPI      Preventive  Mam last Dec - annual recall  Last colon in 2020 - 5 year recall  DEXA last December - focal osteopenia - takes Vit D  Hx of melanoma x 2 and AKs - sees Derm q 6 months    2.  HTN  BP well controlled today  On hctz and losartan  Recent A1c 5.8  Recent lytes, GFR/Cr WNL    3.  HLD  With NAFLD  Controlled with red yeast rice    ROS: No TIA's or dysphagia. No prolonged cough. No dyspnea or chest pain on exertion.  No abdominal pain, change in bowel habits, black or bloody stools.  No urinary tract symptoms. She is post menopausal. No hot flashes, abnormal vaginal bleeding, discharge or unexpected pelvic pain. No new breast lumps, breast pain or nipple discharge.        OBJECTIVE   Visit Vitals  Vitals:    03/01/22 0917   BP: 126/72   Pulse: 72        BP Readings from Last 3 Encounters:   03/01/22 126/72   02/26/21 114/62   02/12/21 134/78        Wt Readings from Last 3 Encounters:   03/01/22 174 lb 9.6 oz (79.2 kg)   02/26/21 173 lb (78.5 kg)   02/12/21 174 lb (78.9 kg)   BMI was elevated today, and weight loss plan recommended is : conventional weight loss and daily exercise regimen.        Physical Exam  Constitutional:       Appearance: Normal appearance.   HENT:      Head: Normocephalic and atraumatic.      Right Ear: Tympanic membrane normal.      Left Ear: Tympanic membrane normal.   Eyes:      General:         Right eye: No discharge.         Left eye: No discharge.      Extraocular Movements: Extraocular movements intact.      Conjunctiva/sclera: Conjunctivae normal.      Pupils: Pupils are equal, round, and reactive to light.   Cardiovascular:      Rate and Rhythm: Normal rate and regular rhythm.      Pulses: Normal pulses.      Heart sounds: Normal heart sounds.    Pulmonary:      Effort: Pulmonary effort is normal.      Breath sounds: Normal breath sounds.   Abdominal:      General: Bowel sounds are normal.      Palpations: Abdomen is soft.   Musculoskeletal:      Cervical back: Normal range of motion and neck supple.      Right lower leg: No edema.      Left lower leg: No edema.   Skin:     General: Skin is warm.      Capillary Refill: Capillary refill takes less than 2 seconds.   Neurological:      General: No focal deficit present.      Mental Status: She is alert and oriented to person, place, and time.   Psychiatric:  Mood and Affect: Mood normal.         Behavior: Behavior normal.          ASSESSMENT AND PLAN     Diagnoses and all orders for this visit:    Katherine Osborne was seen today for medicare awv.    Diagnoses and all orders for this visit:    Medicare annual wellness visit, subsequent    Essential hypertension  -     losartan (COZAAR) 25 MG tablet; Take 1 tablet by mouth daily  -     hydroCHLOROthiazide (HYDRODIURIL) 25 MG tablet; Take 1 tablet by mouth daily    Encounter for screening mammogram for malignant neoplasm of breast  -     MAM TOMO DIGITAL SCREEN BILATERAL; Future    Fatty liver        Preventive  UTD on cancer screenings.  Annual mam ordered.  Plan on repeat DEXA in 5 years.  2.  HTN  Stable.  Cont hctz and losartan.  Recent lytes, GFR/Cr, A1c reassuring.  3.  NAFLD, HLD  Stable.  Lipids OK and LFTS WNL recently.  Cont red yeast rice.      WELLNESS EVALUATION & HEALTH RISK ASSESSMENT     Patient's complete Health Risk Assessment and screening values have been reviewed and are found in Flowsheets. The following problems were reviewed today and where indicated follow up appointments were made and/or referrals ordered.    Positive Risk Factor Screenings with Interventions:  Fall Risk:  Do you feel unsteady or are you worried about falling? : no  2 or more falls in past year?: no  Fall with injury in past year?: (!) yes (fell on some stairs a week or so  ago- thought she was on the bottom step)   Interventions:    Tripped on stairs; consider safety rails                     PREVENTIVE CARE -SCREENINGS      Colon cancer:  UTD    Lung cancer (LDCT): n/a    Hepatitis C:  UTD    Diabetes:   UTD    Lipids:   UTD    AAA:  N/a    PREVENTIVE CARE - FEMALE SCREENINGS     Cervical cancer: completed    Breast cancer: Ordered, UTD    Osteoporosis: UTD        IMMUNIZATIONS     Immunization History   Administered Date(s) Administered    COVID-19, MODERNA BLUE border, Primary or Immunocompromised, (age 12y+), IM, 100 mcg/0.19mL 09/08/2019, 10/06/2019, 05/24/2020    Influenza Virus Vaccine 04/28/2019    Influenza, FLUAD, (age 37 y+), Adjuvanted, 0.47mL 04/29/2019    Pneumococcal, PCV20, PREVNAR 20, (age 18y+), IM, 0.17mL 02/26/2021    Pneumococcal, PPSV23, PNEUMOVAX 23, (age 2y+), SC/IM, 0.51mL 05/18/2018    TDaP, ADACEL (age 3y-64y), BOOSTRIX (age 10y+), IM, 0.47mL 05/11/2017    Zoster Recombinant (Shingrix) 05/23/2017, 08/05/2017        INDIVIDUALIZED SCREENING, EDUCATION, AND PLAN     The patient and any caregiver(s) were counseled on: Healthcare maintenance and preventive items as above., Healthy diet, maintaining a healthy weight, and getting at least 30 minutes of exercise most every day.    The patient  is not  on high risk medication(s) including benzodiaepines.    Based on my evaluation of the patient and the Health Risk Assessment performed today, there  is not  evidence of cognitive impairment.  Medications, allergies, problem list, previous encounters, recent results, medical, social and family history reviewed in the electronic health record. Medications and problems are viewable in the Encounter tab for this visit.    Current Outpatient Medications   Medication Sig Dispense Refill    losartan (COZAAR) 25 MG tablet Take 1 tablet by mouth daily 90 tablet 3    hydroCHLOROthiazide (HYDRODIURIL) 25 MG tablet Take 1 tablet by mouth daily 90 tablet 3    Cholecalciferol 50  MCG (2000 UT) CAPS Take 1 capsule by mouth daily      fluticasone (FLONASE) 50 MCG/ACT nasal spray 1 spray by Nasal route daily as needed      Red Yeast Rice Extract 600 MG CAPS Take 1,200 mg by mouth daily      triamcinolone (KENALOG) 0.1 % cream Apply 1 oz topically as needed       No current facility-administered medications for this visit.        Medications Discontinued During This Encounter   Medication Reason    losartan (COZAAR) 25 MG tablet REORDER    hydroCHLOROthiazide (HYDRODIURIL) 25 MG tablet REORDER    nirmatrelvir/ritonavir (PAXLOVID) 20 x 150 MG & 10 x 100MG  TBPK Therapy completed        Allergies   Allergen Reactions    Adhesive Tape Rash     Bandaids    Amoxicillin Rash    Clindamycin Diarrhea       Past Medical History:   Diagnosis Date    Back pain     Bilateral cataracts     Cancer (HCC)     melanoma removed from arm x2 per hx    Contusion of left breast     Diverticulitis     Epiretinal membrane     Bilateral    H/O seasonal allergies     Hyperlipidemia     Hypertension     Hypertensive disorder     Hypertensive retinopathy of both eyes     Hypokalemia     Ill-defined condition     1973 mono with jaundice    Ill-defined condition     May 2022, Covid+ mild case    Kidney stone     Liver disease     fatty liver    Macular drusen, bilateral     Nuclear senile cataract of both eyes     Osteopenia     Palpitations     PUD (peptic ulcer disease)     ? of over 20 yrs ago    Submandibular lymphadenopathy     Vitreous floaters             Procedure Laterality Date    CHOLECYSTECTOMY, LAPAROSCOPIC  01/29/2021    Laparoscopic cholecystectomy - 01/31/2021 MD    COLONOSCOPY  03/07/2019    [facility] - Colonoscopy diverticulosis, polyp 05/07/2019     CYST REMOVAL  07/05/1981    pilonidal cyst    HYSTERECTOMY (CERVIX STATUS UNKNOWN)  2002    prolaped uterus fibroid    OVARY REMOVAL Bilateral 2002    prolapsed uterus    WISDOM TOOTH EXTRACTION              Problem Relation Age of Onset     Breast Cancer Maternal Aunt         50's with recurrance    Colon Cancer Paternal Grandfather     Prostate Cancer Father         malignant  Macular Degen Father     Cataracts Father         Bilateral    Diabetes Father     Heart Disease Father     Breast Cancer Mother         malignant tumor    Cataracts Mother         Bilateral    Diabetes Mother     Heart Disease Mother     Diabetes Other         Other family hx of    Other Other         Grandmother - malignant tumor of colon, Grandfather - Bilateral degeneration of macula        Social History     Socioeconomic History    Marital status: Married     Spouse name: Not on file    Number of children: Not on file    Years of education: Not on file    Highest education level: Not on file   Occupational History    Not on file   Tobacco Use    Smoking status: Never    Smokeless tobacco: Never   Substance and Sexual Activity    Alcohol use: Not Currently    Drug use: Never    Sexual activity: Not on file   Other Topics Concern    Not on file   Social History Narrative    Married  Two Chldren  Retired Runner, 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       Social Determinants of Health     Financial Resource Strain: Not on file   Food Insecurity: Not on file   Transportation Needs: Not on file   Physical Activity: Sufficiently Active    Days of Exercise per Week: 4 days    Minutes of Exercise per Session: 60 min   Stress: Not on file   Social Connections: Not on file   Intimate Partner Violence: Not on file   Housing Stability: Not on file           Patient Care Team:  Amparo Bristol, MD as PCP - General  Pedro P Clarita Crane, MD as Surgeon  Cassie Charlesetta Ivory, APRN - NP as Referring Physician  Evern Bio, MD (Obstetrics & Gynecology)  Danelle Earthly, MD (Ophthalmology)    No follow-up provider specified.  Future Appointments   Date Time Provider Department Center   03/09/2023  8:15 AM Amparo Bristol, MD [facility]         Amparo Bristol, MD, 03/01/22   This  encounter has been electronically signed

## 2022-03-29 ENCOUNTER — Encounter

## 2022-04-15 NOTE — Telephone Encounter (Signed)
Reviewed [facility] report.  OK to DB 115 tomorrow.

## 2022-04-15 NOTE — Telephone Encounter (Signed)
I have printed the [facility] note for you to review and advise on if you feel that you need to f/u with her.  We have no openings currently.

## 2022-04-15 NOTE — Telephone Encounter (Signed)
Pt called back. Scheduled as provider requested. 10/13  at 115

## 2022-04-15 NOTE — Telephone Encounter (Signed)
Patient was seen at [facility] and needs a follow up appointment.  Facility: [facility] in [place]  Reason for visit: Left arm swollen for 24 hours  Date of visit: 10/12  Time frame needed to be seen: please advise  Pt scheduled? Appt date and time: please advise    Caller and Callback #: patient and Call back #: [phone]  Complaint:   Chief Complaint   Patient presents with    Follow-Up from Hospital    Arm Swelling     Was just seen this morning at wic in [place]-    left forearm swollen  over 24 hrs  fingers are tingling  feels like someone wrapped ace bandage around arm  1 wk ago had a spot frozen at derm office  PA at wic said that lumph nodes are fine  chest is fine  He didnt know why its swollen  no evidence of infection  arm is not hot    PA rec a CT scan    Please advise        RED FLAGS - If Yes, transfer call to MA or instruct to call 911  Chest pain or pressure not related to coughing [] Y  [x] N   Reports struggling to breathe [] Y  [x] N   Unable to speak in full sentences due to difficulty breathing [] Y  [x] N   Passed out or fainted today [] Y  [x] N   New numbness, weakness, slurred speech, confusion or vision loss [x] Y  [] N         Disposition:  [] Transferred to MA  [x] Note routed to MA pool as Urgent  [] Appt today with PCP  [] Appt today with other provider  [] Appt within 7 days with PCP  [] Appt within 7 days with other provider  [] Unable to schedule. Comments required:   [] Patient declined appointment. Comments required:

## 2022-04-16 ENCOUNTER — Ambulatory Visit: Admit: 2022-04-16 | Discharge: 2022-04-16 | Payer: MEDICARE | Attending: Family Medicine | Primary: Family Medicine

## 2022-04-16 DIAGNOSIS — M7989 Other specified soft tissue disorders: Secondary | ICD-10-CM

## 2022-04-16 NOTE — Progress Notes (Signed)
HISTORY OF PRESENT ILLNESS  Katherine Osborne is a 71 y.o. female who presents for acute visit, c/o L arm swelling    HPI     L arm swelling  S/p L distal lateral upper arm cryo to derm lesion on 10/5  6 days later noticed sig swelling and tightness in L forearm  Was seen at outside ER  Didn't feel infected or acute issue  Hx of ipsilateral forearm melanoma excisions  Has been icing, using heat, elevating and using NSAIDs  Reports none of this made significant difference other than elevated  Reports swelling is down, less red  Reports resolved numbness of fingers  No imaging done at outside facility  Outside records reviewed    Patient Active Problem List    Diagnosis Date Noted    Left arm swelling 04/16/2022    Encounter for immunization 02/26/2021    Fatty liver 01/14/2021    Symptomatic cholelithiasis 01/14/2021    Medicare annual wellness visit, subsequent 02/01/2020    Preventative health care 02/01/2020    Essential hypertension 02/01/2020     Current Outpatient Medications   Medication Sig Dispense Refill    losartan (COZAAR) 25 MG tablet Take 1 tablet by mouth daily 90 tablet 3    hydroCHLOROthiazide (HYDRODIURIL) 25 MG tablet Take 1 tablet by mouth daily 90 tablet 3    Cholecalciferol 50 MCG (2000 UT) CAPS Take 1 capsule by mouth daily      fluticasone (FLONASE) 50 MCG/ACT nasal spray 1 spray by Nasal route daily as needed      Red Yeast Rice Extract 600 MG CAPS Take 1,200 mg by mouth daily      triamcinolone (KENALOG) 0.1 % cream Apply 1 oz topically as needed       No current facility-administered medications for this visit.     Allergies   Allergen Reactions    Adhesive Tape Rash     Bandaids    Amoxicillin Rash    Clindamycin Diarrhea     Past Medical History:   Diagnosis Date    Back pain     Bilateral cataracts     Cancer (HCC)     melanoma removed from arm x2 per hx    Contusion of left breast     Diverticulitis     Epiretinal membrane     Bilateral    H/O seasonal allergies     Hyperlipidemia      Hypertension     Hypertensive disorder     Hypertensive retinopathy of both eyes     Hypokalemia     Ill-defined condition     1973 mono with jaundice    Ill-defined condition     May 2022, Covid+ mild case    Kidney stone     Liver disease     fatty liver    Macular drusen, bilateral     Nuclear senile cataract of both eyes     Osteopenia     Palpitations     PUD (peptic ulcer disease)     ? of over 20 yrs ago    Submandibular lymphadenopathy     Vitreous floaters      Past Surgical History:   Procedure Laterality Date    CHOLECYSTECTOMY, LAPAROSCOPIC  01/29/2021    Laparoscopic cholecystectomy - Dimas Aguas MD    COLONOSCOPY  03/07/2019    [facility] - Colonoscopy diverticulosis, polyp Albin Fischer     CYST REMOVAL  07/05/1981    pilonidal  cyst    HYSTERECTOMY (CERVIX STATUS UNKNOWN)  2002    prolaped uterus fibroid    OVARY REMOVAL Bilateral 2002    prolapsed uterus    WISDOM TOOTH EXTRACTION       Family History   Problem Relation Age of Onset    Breast Cancer Maternal Aunt         50's with recurrance    Colon Cancer Paternal Grandfather     Prostate Cancer Father         malignant    Macular Degen Father     Cataracts Father         Bilateral    Diabetes Father     Heart Disease Father     Breast Cancer Mother         malignant tumor    Cataracts Mother         Bilateral    Diabetes Mother     Heart Disease Mother     Diabetes Other         Other family hx of    Other Other         Grandmother - malignant tumor of colon, Grandfather - Bilateral degeneration of macula     Social History     Tobacco Use    Smoking status: Never    Smokeless tobacco: Never   Substance Use Topics    Alcohol use: Not Currently      Review of Systems   Constitutional:  Negative for chills, fatigue and fever.   Respiratory:  Negative for cough, shortness of breath and wheezing.    Cardiovascular:  Negative for chest pain, palpitations and leg swelling.   Gastrointestinal:  Negative for nausea and vomiting.    Musculoskeletal:  Negative for arthralgias and joint swelling.   Neurological:  Negative for dizziness, light-headedness, numbness and headaches.        Vitals:    04/16/22 1327   BP: 118/66   Pulse: 78     Wt Readings from Last 3 Encounters:   03/01/22 174 lb 9.6 oz (79.2 kg)   02/26/21 173 lb (78.5 kg)   02/12/21 174 lb (78.9 kg)       Body mass index is 29.05 kg/m.      Physical Exam  Constitutional:       Appearance: Normal appearance.   Musculoskeletal:      Comments: L lateral outside distal forearm with nickel sized abrasion with minimal ulceration without induration or purulence  Mild erythema around above lesion  No sig swelling locally around lesion, no olecranon swelling, minimal prox forearm swelling  No numbness of fingers, normal L radial pulse, no pain with flex/ext of L wrist   Neurological:      Mental Status: She is alert.          ASSESSMENT and PLAN  Mishawn was seen today for arm swelling.    Diagnoses and all orders for this visit:    Left arm swelling     1.  Left arm swelling  New.  Suspect post procedural lymphedema vs. Inflammatory reaction.  No signs of arterial compromise or compartment syndrome.  No signs of infection.  Encouraged continued elevation. If worsens or increasing redness, consider antibiotics and imaging.    33 minutes were spent reviewing patient's chart prior to office visit, performing history and exam, providing patient-oriented education, coordinating care, and documenting the clinical encounter.

## 2022-04-16 NOTE — Telephone Encounter (Signed)
Left VM #1 about road construction and to please leave early and enter [facility] from the [address] side.

## 2022-04-16 NOTE — Patient Instructions (Signed)
Keep elevating it  Let me know if redness increases or the swelling increases

## 2022-04-29 ENCOUNTER — Telehealth

## 2022-04-29 NOTE — Telephone Encounter (Signed)
Pt has been notified and aware.  I have changed the order to go to [place].

## 2022-04-29 NOTE — Telephone Encounter (Signed)
Ice arm and elevate and ordered ASAP CT of her arm.

## 2022-04-29 NOTE — Telephone Encounter (Signed)
Caller and Callback #: patient and Call back #: [phone]  Complaint:   Chief Complaint   Patient presents with    Arm Pain    Tingling       Patient states she was seen for left arm pain and swelling on Oct 13 and at that time was advised by the provider to call the office if those symptoms returned.  She states there is tingling in the fingers of that arm.  Call transferred to MA with red flag.    RED FLAGS - If Yes, transfer call to MA or instruct to call 911  Chest pain or pressure not related to coughing [] Y  [x] N   Reports struggling to breathe [] Y  [x] N   Unable to speak in full sentences due to difficulty breathing [] Y  [x] N   Passed out or fainted today [] Y  [x] N   New numbness, weakness, slurred speech, confusion or vision loss [x] Y  [] N         Disposition:  [x] Transferred to MA  [] Note routed to MA pool as Urgent  [] Appt today with PCP  [] Appt today with other provider  [] Appt within 7 days with PCP  [] Appt within 7 days with other provider  [] Unable to schedule. Comments required:   [] Patient declined appointment. Comments required:

## 2022-04-29 NOTE — Telephone Encounter (Signed)
Spoke to the pt and she tells me that the swelling has come back on the left arm. Arm is stiff can fell the swelling spreading. Now fingers on the same arm is tingling. Was told to call PCP if it came back.

## 2022-05-11 ENCOUNTER — Encounter

## 2022-05-12 NOTE — Telephone Encounter (Signed)
Let her know her CT didn't show any signs of deep infection or bone infection.

## 2022-05-12 NOTE — Telephone Encounter (Signed)
Called pt, she is aware. She verbalized understanding and stated if it swells again she will call again.

## 2022-06-05 MED ORDER — NIRMATRELVIR&RITONAVIR 300/100 20 X 150 MG & 10 X 100MG PO TBPK
20 x 150 MG & 10 x 100MG | ORAL_TABLET | ORAL | 0 refills | Status: AC
Start: 2022-06-05 — End: 2022-06-10

## 2022-06-05 NOTE — Telephone Encounter (Signed)
On call note- paged sat am call returned promptly- spoke with spouse- pt on day 2 of symptoms, tested postive this morning- would like paxlovid- labs reviewed form Aug- no renal/hepatic concerns- no meds to hold or reduce- reviewed side effects- rx sent to pharmacy

## 2022-06-06 NOTE — Telephone Encounter (Signed)
On-call note-paged 12/to approximately 9:00 a.m.Marland Kitchen  Spoke with spouse.  Patient started with cold symptoms about 2 weeks ago although she got better.  However about 2 days ago came down with a different set of symptoms fever body aches runny nose-tested positive for COVID which she is had before almost a year ago.  Did well in the past on Paxlovid would like to try it again.  Chart reviewed no abnormalities with renal/liver functions, meds reviewed-no prescription meds to hold but she may want to hold the red yeast rice extract while on Paxlovid

## 2022-07-06 ENCOUNTER — Ambulatory Visit: Payer: MEDICARE | Primary: Family Medicine

## 2022-08-24 NOTE — Telephone Encounter (Signed)
Called pt, she states she is going to go to ED near here so she can have imaging done. She states her WIC near here doesn't do imaging. I offered appts for later this week with [place] but pt decided to go to ED today near here. Instructed for her to keep [place] posted. She verbalized understanding and had no further questions.

## 2022-08-24 NOTE — Telephone Encounter (Addendum)
Caller and Callback #: patient and Call back #: [phone]  Complaint:   Chief Complaint   Patient presents with    Knee Pain         Pain or swelling location:  patient reports left knee pain x 2 weeks with no fall, or trauma.  She states there is no swelling in the area and the only thing she has done differently is starting a new vitamin, called Centrum Silver, so she stopped the vitamin, but there has not been drastic improvement after stopping the vitamin.  She states Ibuprofen "take the edge off" of the pain.   She wonders about getting an xray or what the next step would be.     She is willing to be seen at [facility] or [facility] if needed, as she knows an xray would be done if she went there.    Recent injury []$ Y  [x]$ N   Describe:        RED FLAGS - If Yes, transfer call to MA   Fever of 102 or higher over the last 24 hours []$ Y  [x]$ N   New or spreading red skin []$ Y  [x]$ N   Unable to walk due to the pain []$ Y  [x]$ N   Passed out or fainted today []$ Y  [x]$ N   New numbness, weakness, slurred speech, confusion or vision loss []$ Y  [x]$ N   If leg(s)    History of blood clot in leg (DVT) or lungs (Pulmonary Embolus) []$ Y  [x]$ N   History of congestive heart failure (CHF) []$ Y  [x]$ N   If back pain    New incontinence of bowel or bladder []$ Y  []$ N   Unable to urinate []$ Y  []$ N   Pain with urinating []$ Y  []$ N         Disposition:  []$ Transferred to MA  [x]$ Note routed to MA pool as Urgent  []$ Appt today with PCP  []$ Appt today with other provider  []$ Appt within 7 days with PCP  []$ Appt within 7 days with other provider  [x]$ Unable to schedule. Comments required: no openings  []$ Patient declined appointment. Comments required:

## 2022-09-27 ENCOUNTER — Inpatient Hospital Stay: Admit: 2022-09-27 | Payer: MEDICARE | Primary: Family Medicine

## 2022-09-27 DIAGNOSIS — Z1231 Encounter for screening mammogram for malignant neoplasm of breast: Secondary | ICD-10-CM

## 2022-10-20 ENCOUNTER — Other Ambulatory Visit: Payer: Self-pay

## 2022-10-20 ENCOUNTER — Emergency Department (HOSPITAL_BASED_OUTPATIENT_CLINIC_OR_DEPARTMENT_OTHER): Payer: Medicare Other

## 2022-10-20 ENCOUNTER — Emergency Department (HOSPITAL_BASED_OUTPATIENT_CLINIC_OR_DEPARTMENT_OTHER)
Admission: EM | Admit: 2022-10-20 | Discharge: 2022-10-20 | Disposition: A | Discharge status: Home / Self Care | Payer: Medicare Other | Attending: Emergency Medicine | Admitting: Emergency Medicine

## 2022-10-20 DIAGNOSIS — Y93H2 Activity, gardening and landscaping: Secondary | ICD-10-CM | POA: Insufficient documentation

## 2022-10-20 DIAGNOSIS — R519 Headache, unspecified: Secondary | ICD-10-CM | POA: Diagnosis present

## 2022-10-20 DIAGNOSIS — W2209XA Striking against other stationary object, initial encounter: Secondary | ICD-10-CM | POA: Diagnosis not present

## 2022-10-20 DIAGNOSIS — Y92007 Garden or yard of unspecified non-institutional (private) residence as the place of occurrence of the external cause: Secondary | ICD-10-CM | POA: Diagnosis not present

## 2022-10-20 DIAGNOSIS — S0083XA Contusion of other part of head, initial encounter: Secondary | ICD-10-CM | POA: Diagnosis not present

## 2022-10-20 DIAGNOSIS — S0990XA Unspecified injury of head, initial encounter: Secondary | ICD-10-CM

## 2022-10-20 NOTE — ED Triage Notes (Addendum)
Pt hit head on rail while she was gardening.  She feels worse today and [facility] told her to come here to get a CT. Pt had no LOC.  "Feels like it is going into my eye and having sharp pains - above right brow.

## 2022-10-20 NOTE — ED Provider Notes (Signed)
[place] [facility] Provider Note   CSN: [id number] Arrival date & time: 10/20/22  1845     History Chief Complaint  Patient presents with   Head Injury    HPI Laurie Cross is a 72 y.o. female presenting for multiple complaints.  States that yesterday she hit her head on a railing while she was doing yard work.  Is having retro-orbital right headache.  Denies syncope loss of consciousness, blood thinner use.  Otherwise no medical problems.   Patient's recorded medical, surgical, social, medication list and allergies were reviewed in the Snapshot window as part of the initial history.   Review of Systems   Review of Systems  Constitutional:  Negative for chills and fever.  HENT:  Negative for ear pain and sore throat.   Eyes:  Negative for pain and visual disturbance.  Respiratory:  Negative for cough and shortness of breath.   Cardiovascular:  Negative for chest pain and palpitations.  Gastrointestinal:  Negative for abdominal pain and vomiting.  Genitourinary:  Negative for dysuria and hematuria.  Musculoskeletal:  Negative for arthralgias and back pain.  Skin:  Negative for color change and rash.  Neurological:  Positive for headaches. Negative for seizures and syncope.  All other systems reviewed and are negative.   Physical Exam Updated Vital Signs BP (!) 144/59 (BP Location: Right Arm)   Pulse (!) 58   Temp 98 F (36.7 C)   Resp 20   Ht  (1.499 m)   Wt 70.3 kg   SpO2 99%   BMI 31.31 kg/m  Physical Exam Vitals and nursing note reviewed.  Constitutional:      General: She is not in acute distress.    Appearance: She is well-developed.  HENT:     Head: Normocephalic and atraumatic.  Eyes:     Conjunctiva/sclera: Conjunctivae normal.  Cardiovascular:     Rate and Rhythm: Normal rate and regular rhythm.     Heart sounds: No murmur heard. Pulmonary:     Effort: Pulmonary effort is normal. No respiratory  distress.     Breath sounds: Normal breath sounds.  Abdominal:     General: There is no distension.     Palpations: Abdomen is soft.     Tenderness: There is no abdominal tenderness. There is no right CVA tenderness or left CVA tenderness.  Musculoskeletal:        General: Deformity (Substantial bruising to right forehead) present. No swelling or tenderness. Normal range of motion.     Cervical back: Neck supple.  Skin:    General: Skin is warm and dry.  Neurological:     General: No focal deficit present.     Mental Status: She is alert and oriented to person, place, and time. Mental status is at baseline.     Cranial Nerves: No cranial nerve deficit.      ED Course/ Medical Decision Making/ A&P    Procedures Procedures   Medications Ordered in ED Medications - No data to display  Medical Decision Making:   Patient history of present hospitals and findings raise concern for closed head injury. Intracranial hemorrhage is considered possible given ongoing headache 24 hours after the event, worsening headache in the interim.  Patient does have cervical neck tenderness without obvious deformity.  Will evaluate for substantial intracranial/cervical pathology as below. Radiology  All images reviewed independently. Agree with radiology report at this time.   CT Head Wo Contrast  Result Date:  10/20/2022 CLINICAL DATA:  72 y/o female who hit head on deck rail while gardening on Monday . Bruise above right brow line. Patient states she has shooting pains into right eye. EXAM: CT HEAD WITHOUT CONTRAST CT CERVICAL SPINE WITHOUT CONTRAST TECHNIQUE: Multidetector CT imaging of the head and cervical spine was performed following the standard protocol without intravenous contrast. Multiplanar CT image reconstructions of the cervical spine were also generated. RADIATION DOSE REDUCTION: This exam was performed according to the departmental dose-optimization program which includes automated exposure  control, adjustment of the mA and/or kV according to patient size and/or use of iterative reconstruction technique. COMPARISON:  None Available. FINDINGS: CT HEAD FINDINGS Brain: No evidence of large-territorial acute infarction. No parenchymal hemorrhage. No mass lesion. No extra-axial collection. No mass effect or midline shift. No hydrocephalus. Basilar cisterns are patent. Vascular: No hyperdense vessel. Skull: No acute fracture or focal lesion. Sinuses/Orbits: Paranasal sinuses and mastoid air cells are clear. Bilateral lens replacement. Otherwise the orbits are unremarkable. Other: None. CT CERVICAL SPINE FINDINGS Alignment: Reversal of the cervical spine centered at the C4-C5 level. Skull base and vertebrae: Multilevel moderate degenerative changes of the spine. No associated severe osseous neural foraminal stenosis or severe osseous central canal stenosis. No acute fracture. No aggressive appearing focal osseous lesion or focal pathologic process. Soft tissues and spinal canal: No prevertebral fluid or swelling. No visible canal hematoma. Upper chest: Unremarkable. Other: None. IMPRESSION: 1. No acute intracranial abnormality. 2. No acute displaced fracture or traumatic listhesis of the cervical spine. Electronically Signed   By: Tish Frederickson M.D.   On: 10/20/2022 20:26   CT Cervical Spine Wo Contrast  Result Date: 10/20/2022 CLINICAL DATA:  72 y/o female who hit head on deck rail while gardening on Monday . Bruise above right brow line. Patient states she has shooting pains into right eye. EXAM: CT HEAD WITHOUT CONTRAST CT CERVICAL SPINE WITHOUT CONTRAST TECHNIQUE: Multidetector CT imaging of the head and cervical spine was performed following the standard protocol without intravenous contrast. Multiplanar CT image reconstructions of the cervical spine were also generated. RADIATION DOSE REDUCTION: This exam was performed according to the departmental dose-optimization program which includes  automated exposure control, adjustment of the mA and/or kV according to patient size and/or use of iterative reconstruction technique. COMPARISON:  None Available. FINDINGS: CT HEAD FINDINGS Brain: No evidence of large-territorial acute infarction. No parenchymal hemorrhage. No mass lesion. No extra-axial collection. No mass effect or midline shift. No hydrocephalus. Basilar cisterns are patent. Vascular: No hyperdense vessel. Skull: No acute fracture or focal lesion. Sinuses/Orbits: Paranasal sinuses and mastoid air cells are clear. Bilateral lens replacement. Otherwise the orbits are unremarkable. Other: None. CT CERVICAL SPINE FINDINGS Alignment: Reversal of the cervical spine centered at the C4-C5 level. Skull base and vertebrae: Multilevel moderate degenerative changes of the spine. No associated severe osseous neural foraminal stenosis or severe osseous central canal stenosis. No acute fracture. No aggressive appearing focal osseous lesion or focal pathologic process. Soft tissues and spinal canal: No prevertebral fluid or swelling. No visible canal hematoma. Upper chest: Unremarkable. Other: None. IMPRESSION: 1. No acute intracranial abnormality. 2. No acute displaced fracture or traumatic listhesis of the cervical spine. Electronically Signed   By: Tish Frederickson M.D.   On: 10/20/2022 20:26     Final Assessment and Plan:   Objective findings grossly reassuring at this time.  No focal pathology on exam.  Symptoms mild and patient well-appearing.  Stable for outpatient care management follow-up with PCP.  Disposition:  I have considered need for hospitalization, however, considering all of the above, I believe this patient is stable for discharge at this time.  Patient/family educated about specific return precautions for given chief complaint and symptoms.  Patient/family educated about follow-up with PCP.     Patient/family expressed understanding of return precautions and need for follow-up.  Patient spoken to regarding all imaging and laboratory results and appropriate follow up for these results. All education provided in verbal form with additional information in written form. Time was allowed for answering of patient questions. Patient discharged.    Emergency Department Medication Summary:   Medications - No data to display       Clinical Impression:  1. Minor head injury, initial encounter      Discharge   Final Clinical Impression(s) / ED Diagnoses Final diagnoses:  Minor head injury, initial encounter    Rx / DC Orders ED Discharge Orders     None         Glyn Ade, MD 10/20/22 2034

## 2022-11-12 NOTE — Telephone Encounter (Signed)
Per review of chart/HIN, patient X-ray 08/25/2022, showed no acute bony abnormality of left knee. She was given a short course of Prednisone by ED provider as they felt she had overuse syndrome with some mild inflammation and swelling. He instructed her at that time to follow up with PCP, that she may need an MRI if her symptoms did not improve. Would you like an office visit to discuss this? Please advise, thank you.

## 2022-11-12 NOTE — Telephone Encounter (Signed)
Yes, please schedule appt and we can do a steroid injection if appropriate.

## 2022-11-12 NOTE — Telephone Encounter (Signed)
Caller and Callback #: patient and [phone]  Complaint:   Chief Complaint   Patient presents with    Knee Pain     Pain, ongoing discomfort.         Pain or swelling location: Left Knee    Recent injury [] Y  [x] N   Describe:    Patient states that she has had problems with her left knee for some time. Went to [facility] ED at the end of March, she believes. Had Xray imaging, came back inconclusive. No findings. Her knee is still really bothering her. Would like to speak with PCP/MA about next steps in care.       RED FLAGS - If Yes, transfer call to MA   Chest pain, chest discomfort, chest burning or pressure not related to coughing [] Y  [x] N   Fever of 102 or higher over the last 24 hours [] Y  [x] N   New or spreading red skin [] Y  [x] N   Unable to walk due to the pain [] Y  [x] N   New or worsening shortness of breath [] Y  [x] N   Passed out or fainted today [] Y  [x] N   New numbness, weakness, slurred speech, confusion or vision loss [] Y  [x] N   If leg(s)    History of blood clot in leg (DVT) or lungs (Pulmonary Embolus) [] Y  [x] N   History of congestive heart failure (CHF) [] Y  [x] N   If back pain    New incontinence of bowel or bladder [] Y  [x] N   Unable to urinate [] Y  [x] N   Pain with urinating [] Y  [x] N         Disposition:  [] Transferred to MA  [] Note routed to MA pool as Urgent  [] Appt today with PCP  [] Appt today with other provider  [] Appt within 7 days with PCP  [] Appt within 7 days with other provider  [x] Unable to schedule. Comments required: PCP to advise.  [] Patient declined appointment. Comments required:

## 2022-11-15 NOTE — Telephone Encounter (Signed)
Noted. Patient contacted and informed of PCP message. Scheduled for soonest available appointment. No other questions or concerns at this time. Thank you.

## 2022-11-30 NOTE — Telephone Encounter (Signed)
Ok to move up wherever able.

## 2022-11-30 NOTE — Telephone Encounter (Signed)
Rescheduled to 06/05 at 10:45

## 2022-11-30 NOTE — Telephone Encounter (Signed)
Name and DOB verified.   Who called? patient  Reason for cancelling? "That is the same time as my grandsons graduation party. I need to r/s".  Appointment date being canceled: 12/10/22  Appointment time being canceled: 1.45  Appointment type (PE, MWV, AWV, acute, office visit, virtual visit, hospital follow up): acute  Does patient want to be placed on waitlist: "that's fine"  Was no show policy reviewed? no  Was Virtual Visit offered? (If applicable) no   Please document if Pt declines virtual.    Please advise as no other acute visits in the near future at the time of the call

## 2022-12-08 ENCOUNTER — Ambulatory Visit: Admit: 2022-12-08 | Discharge: 2022-12-08 | Payer: MEDICARE | Attending: Family Medicine | Primary: Family Medicine

## 2022-12-08 DIAGNOSIS — G8929 Other chronic pain: Secondary | ICD-10-CM

## 2022-12-08 DIAGNOSIS — M25562 Pain in left knee: Secondary | ICD-10-CM

## 2022-12-08 NOTE — Patient Instructions (Signed)
Ok to take some ibuprofen or aleve  Can also try Voltaren gel (diclofenac)  Ordered Mri to look for any meniscal tears  Do home rehab  Call if worsens

## 2022-12-08 NOTE — Progress Notes (Signed)
HISTORY OF PRESENT ILLNESS  Katherine Osborne is a 72 y.o. female who presents for acute visit, c/o knee pain    HPI     L knee pain  Pt first called about this back in February  Worsening pain without trauma hx  Walks regularly but found at one point recently couldnt walk anymore and husband had to pick her up  Has been using tylenol and was prescribed prednisone by the ER in March  Xray done in March was normal  Reports walking is OK, but stairs and sitting are particularly bothersome  Can't lay on R side at night without L knee hurting  Reports pain is mostly medial aspect  No sig swelling  No clicking/locking  Knee feels unstable going down stairs      Patient Active Problem List    Diagnosis Date Noted    Left arm swelling 04/16/2022    Encounter for immunization 02/26/2021    Fatty liver 01/14/2021    Symptomatic cholelithiasis 01/14/2021    Medicare annual wellness visit, subsequent 02/01/2020    Preventative health care 02/01/2020    Essential hypertension 02/01/2020     Current Outpatient Medications   Medication Sig Dispense Refill    losartan (COZAAR) 25 MG tablet Take 1 tablet by mouth daily 90 tablet 3    hydroCHLOROthiazide (HYDRODIURIL) 25 MG tablet Take 1 tablet by mouth daily 90 tablet 3    Cholecalciferol 50 MCG (2000 UT) CAPS Take 1 capsule by mouth daily      fluticasone (FLONASE) 50 MCG/ACT nasal spray 1 spray by Nasal route daily as needed      Red Yeast Rice Extract 600 MG CAPS Take 1,200 mg by mouth daily      triamcinolone (KENALOG) 0.1 % cream Apply 1 oz topically as needed       No current facility-administered medications for this visit.     Allergies   Allergen Reactions    Adhesive Tape Rash     Bandaids    Amoxicillin Rash    Clindamycin Diarrhea     Past Medical History:   Diagnosis Date    Back pain     Bilateral cataracts     Cancer (HCC)     melanoma removed from arm x2 per hx    Contusion of left breast     Diverticulitis     Epiretinal membrane     Bilateral    H/O seasonal  allergies     Hyperlipidemia     Hypertension     Hypertensive disorder     Hypertensive retinopathy of both eyes     Hypokalemia     Ill-defined condition     1973 mono with jaundice    Ill-defined condition     May 2022, Covid+ mild case    Kidney stone     Liver disease     fatty liver    Macular drusen, bilateral     Nuclear senile cataract of both eyes     Osteopenia     Palpitations     PUD (peptic ulcer disease)     ? of over 20 yrs ago    Submandibular lymphadenopathy     Vitreous floaters      Past Surgical History:   Procedure Laterality Date    CHOLECYSTECTOMY, LAPAROSCOPIC  01/29/2021    Laparoscopic cholecystectomy - Dimas Aguas MD    COLONOSCOPY  03/07/2019    [facility] - Colonoscopy diverticulosis, polyp Albin Fischer  CYST REMOVAL  07/05/1981    pilonidal cyst    HYSTERECTOMY (CERVIX STATUS UNKNOWN)  2002    prolaped uterus fibroid    OVARY REMOVAL Bilateral 2002    prolapsed uterus    WISDOM TOOTH EXTRACTION       Family History   Problem Relation Age of Onset    Breast Cancer Mother 20        malignant tumor    Cataracts Mother         Bilateral    Diabetes Mother     Heart Disease Mother     Prostate Cancer Father         malignant    Macular Degen Father     Cataracts Father         Bilateral    Diabetes Father     Heart Disease Father     Colon Cancer Paternal Grandfather     Breast Cancer Maternal Aunt         60's with recurrance    Diabetes Other         Other family hx of    Other Other         Grandmother - malignant tumor of colon, Grandfather - Bilateral degeneration of macula     Social History     Tobacco Use    Smoking status: Never    Smokeless tobacco: Never   Substance Use Topics    Alcohol use: Not Currently      Review of Systems   Constitutional:  Negative for fever.   Musculoskeletal:  Positive for arthralgias. Negative for gait problem and joint swelling.   Neurological:  Negative for weakness and numbness.        Vitals:    12/08/22 1047   BP: 122/70      Wt Readings from Last 3 Encounters:   03/01/22 79.2 kg (174 lb 9.6 oz)   02/26/21 78.5 kg (173 lb)   02/12/21 78.9 kg (174 lb)       Body mass index is 29.05 kg/m.  BMI was elevated today, and weight loss plan recommended is : conventional weight loss and daily exercise regimen.     Physical Exam  Constitutional:       Appearance: Normal appearance.   Musculoskeletal:      Comments: L knee normal appearing  No sig effusion  Normal ROM  TTP medial joint line and pes bursa  No crepitus through ROM  Neg drawers, lat laxity, mccmurrays; some pain with medial stressing on mccmurray   Neurological:      General: No focal deficit present.      Mental Status: She is alert and oriented to person, place, and time.   Psychiatric:         Mood and Affect: Mood normal.         Behavior: Behavior normal.          ASSESSMENT and PLAN  Katherine Osborne was seen today for knee pain.    Diagnoses and all orders for this visit:    Chronic pain of left knee  -     Cancel: MRI KNEE LEFT WO CONTRAST; Future  -     MRI KNEE LEFT WO CONTRAST; Future     1.  Chronic pain of L knee  New.  At this point has had persistent pain for 4 months, normal xray, and no benefit with tylenol or prednisone.  Clinically suspect medial mensical tear vs. Pes bursitis.  Trial  NSAIDS (oral and topical).  Gave home rehab regimen. Get MRI to r/o meniscal tear.  Can consider steroid injection if pain not improving.

## 2022-12-10 ENCOUNTER — Encounter: Payer: MEDICARE | Attending: Family Medicine | Primary: Family Medicine

## 2022-12-13 NOTE — Telephone Encounter (Signed)
Attempted to contact the patient, there was no answer. Left a detailed voicemail on patient's personal voicemail regarding this. Instructed the patient to call back with any questions. Thank you.

## 2022-12-13 NOTE — Telephone Encounter (Signed)
Katherine Osborne called stating [facility] hospital still has not received the MRI order from Dr Laurell Roof. She says she was just in to see him on June 5, and he was aware of where the order needed to be sent, so she thinks it just needs to be re-faxed and followed up on to make sure [facility] got it.    Please advise and she is waiting to get that scheduled.  [phone]

## 2022-12-13 NOTE — Telephone Encounter (Signed)
Contacted [facility], they report that the radiology team needs the order. Fax number obtained. Will fax to [facility] radiology. Thank you

## 2023-02-09 ENCOUNTER — Encounter

## 2023-02-09 NOTE — Telephone Encounter (Signed)
The pt is requesting to have labs done prior to the appt 9/4 and she wants them sent to [facility] [phone].      The pt wanted to know the results of her MRI done at [facility] but I told her that we never got the MRI results back from them she is going to call them and have them sent to the office - FYI     And       The pt fell 6/21 and she did not hit her head. This happened when they were camping and she walked out the camper door and there were not steps and she fell down out of the camper and it was 36 " drop.   She went to the ED and she has sprained r ankle and on her left foot she has a broken toe and sprained toe.  They did xray as well at the hospital.. The pt states that she is sore. But she wants to makes sure we got the ED notes prior to 9/4. She is going to call them and have them sent.     Please call  [phone]

## 2023-02-09 NOTE — Telephone Encounter (Deleted)
This encounter was created in error - please disregard.

## 2023-02-09 NOTE — Telephone Encounter (Signed)
Rec'd reports and they were placed in your bin.

## 2023-02-10 NOTE — Telephone Encounter (Signed)
CMP, A1c, lipids, Vit D please.  Fasting.    I saw the ER report from [facility], but no MRI.  Did they come as well?

## 2023-02-11 ENCOUNTER — Telehealth

## 2023-02-11 NOTE — Telephone Encounter (Signed)
Called patient, she is aware of lab orders being placed and faxed.  She asked if we received her imaging results from [facility] ER. Informed her that I do not see them in her chart.

## 2023-02-11 NOTE — Telephone Encounter (Signed)
Called [facility] medical records, they will refax the MRI report.

## 2023-02-11 NOTE — Telephone Encounter (Signed)
Let her know I reviewed her MRI and she has a large meniscal tear in her knee.  I'd recommend seeing Orthopedics.

## 2023-02-11 NOTE — Telephone Encounter (Signed)
Report rec'd and placed in your bin for review.

## 2023-02-11 NOTE — Telephone Encounter (Signed)
Patient contacted and informed of PCP message. Patient verbalized understanding, does not have a preference as to where she is referred, but would like surgery done at [facility] if needed. Referral for [facility] for your approval. Thank you

## 2023-02-24 LAB — COMPREHENSIVE METABOLIC PANEL
ALT: 22 U/L (ref 7–35)
AST: 19 U/L (ref 15–41)
Albumin: 3.9 g/dL (ref 3.5–5.0)
Alkaline Phosphatase: 53 U/L (ref 38–126)
Anion Gap: 7.5 mmol/L (ref 2.0–11.0)
BUN: 16 mg/dL (ref 6–20)
CO2: 28 mmol/L (ref 23–29)
Calcium: 9.4 mg/dL (ref 8.6–10.0)
Chloride: 103 mmol/L (ref 98–107)
Creatinine: 0.89 mg/dL (ref 0.60–1.10)
Est, Glom Filt Rate: >60
Glucose: 118 mg/dL — AB (ref 75–99)
Potassium: 3.6 mmol/L (ref 3.5–5.1)
Sodium: 139 mmol/L (ref 134–143)
Total Bilirubin: 0.9 mg/dL (ref 0.1–1.4)
Total Protein: 6.9 g/dL (ref 6.4–8.3)

## 2023-02-24 NOTE — Progress Notes (Signed)
Last AWV: 03/01/22    PREVENTIVE CARE - SCREENINGS  Colon cancer:  Up to date Colonoscopy done 03/07/19- Diverticulosis and hyperplastic polyp. Repeat 5 years.    Lung cancer: Not indicated Pt never smoked    Smoking Hx  reports that she has never smoked. She has never used smokeless tobacco.   Hepatitis C: Up to date Done 11/29/2014   Lipids:  Due Last done 02/09/22     AAA:  Not indicated No known family Hx       Diabetes: Due Last done 02/09/22     A1C No results found for: "LABA1C", "HBA1C", "HGBA1CEXT", "HBA1CPOC"   DM eye exam Pt non diabetic   DM foot exam There are no preventive care reminders to display for this patient.   Microalb No results found for: "MALB24HUR"     Breast Cancer: Up to date Mammogram done 09/27/22- Benign Finding- Repeat 1 year   Cervical Cancer:  Not indicated Pt age 10.   Osteoporosis: Up to date Dexa done 06/19/21- Osteopenia- Repeat 2 years     Influenza: Due Done 05/17/21 per [facility]   Pneumonia: Up to date 23 done 05/18/18, 20 done 02/26/21   Shingrix: Up to date 2 doses given   Td/Tdap: Up to date Tdap given 05/11/17   COVID 19: Due 3 doses given.     RSV Due None found.          Active orders for [facility] gaps: A1c, Lipid Panel, CMP  HIN reviewed: Yes

## 2023-03-01 NOTE — Telephone Encounter (Signed)
 Reviewed HIN, no results

## 2023-03-01 NOTE — Telephone Encounter (Signed)
Called [facility] Medical Records, spoke with French Ana.   She states that she just spoke with Duwayne Heck and is faxing over the results.

## 2023-03-01 NOTE — Telephone Encounter (Signed)
 Patient is calling to see if we have received her most recent lab results. States that she had the labs drawn at [facility] 7 days ago.    Please advise.  CB [phone]

## 2023-03-01 NOTE — Telephone Encounter (Signed)
 Contacted the patient and informed of no results here or on HIN. Contacted [facility] medical records and they will fax the lab results here. Thank you

## 2023-03-09 ENCOUNTER — Ambulatory Visit: Admit: 2023-03-09 | Discharge: 2023-03-09 | Payer: MEDICARE | Attending: Family Medicine | Primary: Family Medicine

## 2023-03-09 VITALS — BP 128/74 | Ht 65.0 in | Wt 183.4 lb

## 2023-03-09 DIAGNOSIS — Z0000 Encounter for general adult medical examination without abnormal findings: Secondary | ICD-10-CM

## 2023-03-09 MED ORDER — LOSARTAN POTASSIUM 25 MG PO TABS
25 MG | ORAL_TABLET | Freq: Every day | ORAL | 3 refills | Status: DC
Start: 2023-03-09 — End: 2023-12-19

## 2023-03-09 MED ORDER — HYDROCHLOROTHIAZIDE 25 MG PO TABS
25 | ORAL_TABLET | Freq: Every day | ORAL | 3 refills | Status: DC
Start: 2023-03-09 — End: 2024-05-01

## 2023-03-09 NOTE — Patient Instructions (Addendum)
 Labs look good - keep doing what you're doing  What carbs and sugars in diet - sugars still in prediabetic range

## 2023-03-09 NOTE — Progress Notes (Signed)
 [facility]   [address]  [place] [address]  [phone]    SUBSEQUENT MEDICARE [place] VISIT       CHIEF COMPLAINT     Katherine Osborne, 72 y.o. female , presents for AWV    HPI      Preventive  5 year colon due next year  Mam in March WNL - sees GYN, annual recall  Hx of osteopenia on Vit D, recent Vit D check WNL  Sees Derm for hx of melanoma    2.  HTN  Controlled today  Recent lytes, GFR/Cr WNL    3.  HLD  Takes red yeast rice  Recent lipids OK  Comorbid NAFLD with recent LFTs WNL    ROS: No TIA's or dysphagia. No prolonged cough. No dyspnea or chest pain on exertion.  No abdominal pain, change in bowel habits, black or bloody stools.  No urinary tract symptoms. She is post menopausal. No hot flashes, abnormal vaginal bleeding, discharge or unexpected pelvic pain. No new breast lumps, breast pain or nipple discharge.  Healing L 2nd toe fx and R ankle sprain s/p fall.  Sees Ortho in Nov for L knee meniscal tear.      OBJECTIVE   Visit Vitals  Vitals:    03/09/23 0817   BP: 128/74        BP Readings from Last 3 Encounters:   03/09/23 128/74   12/08/22 122/70   04/16/22 118/66        Wt Readings from Last 3 Encounters:   03/09/23 83.2 kg (183 lb 6.4 oz)   03/01/22 79.2 kg (174 lb 9.6 oz)   02/26/21 78.5 kg (173 lb)    BMI was elevated today, and weight loss plan recommended is : conventional weight loss and daily exercise regimen.      Physical Exam  Constitutional:       Appearance: Normal appearance.   HENT:      Head: Normocephalic and atraumatic.      Right Ear: Tympanic membrane normal.      Left Ear: Tympanic membrane normal.   Eyes:      General:         Right eye: No discharge.         Left eye: No discharge.      Extraocular Movements: Extraocular movements intact.      Conjunctiva/sclera: Conjunctivae normal.      Pupils: Pupils are equal, round, and reactive to light.   Cardiovascular:      Rate and Rhythm: Normal rate and regular rhythm.      Pulses:  Normal pulses.      Heart sounds: Normal heart sounds.   Pulmonary:      Effort: Pulmonary effort is normal.      Breath sounds: Normal breath sounds.   Abdominal:      General: Bowel sounds are normal.      Palpations: Abdomen is soft.   Musculoskeletal:      Cervical back: Normal range of motion and neck supple.      Right lower leg: No edema.      Left lower leg: No edema.      Comments: No point tenderness to R ankle or L second toe   Skin:     General: Skin is warm.      Capillary Refill: Capillary refill takes less than 2 seconds.   Neurological:      General:  No focal deficit present.      Mental Status: She is alert and oriented to person, place, and time.   Psychiatric:         Mood and Affect: Mood normal.         Behavior: Behavior normal.            ASSESSMENT AND PLAN     Diagnoses and all orders for this visit:    Katherine Osborne was seen today for medicare awv.    Diagnoses and all orders for this visit:    Medicare annual wellness visit, subsequent    Essential hypertension  -     hydroCHLOROthiazide  (HYDRODIURIL ) 25 MG tablet; Take 1 tablet by mouth daily  -     losartan  (COZAAR ) 25 MG tablet; Take 1 tablet by mouth daily    Fatty liver    Mixed hyperlipidemia    Class 1 obesity without serious comorbidity with body mass index (BMI) of 30.0 to 30.9 in adult, unspecified obesity type    Prediabetes        Preventive  UTD on cancer screenings.  Will get COVID vax from pharm.    2.  HTN  Stable.  Cont current med regimen.  Lytes, GFR/Cr reassuring.  3.  HLD  Stable.  Not statin requiring.  Cont red yeast rice.  4.  NAFLD  Stable.  Normal LFTs recently.  Work on weight loss.  5.  Obesity  Stable.  When MSK issues better will resume walking.  6.  Prediabetes  Stable.  Work on office manager.      WELLNESS EVALUATION & HEALTH RISK ASSESSMENT     Patient's complete Health Risk Assessment and screening values have been reviewed and are found in Flowsheets. The following problems were reviewed today and where  indicated follow up appointments were made and/or referrals ordered.    Positive Risk Factor Screenings with Interventions:  Fall Risk:  Do you feel unsteady or are you worried about falling? : no  2 or more falls in past year?: no  Fall with injury in past year?: (!) yes     Interventions:    Reviewed medications, home hazards, visual acuity, and co-morbidities that can increase risk for falls            General Health and ACP:  General  In general, how would you say your health is?: Very Good  In the past 7 days, have you experienced any of the following: New or Increased Pain, New or Increased Fatigue, Loneliness, Social Isolation, Stress or Anger?: (!) Yes  Select all that apply: (!) New or Increased Pain (knee)  Do you have a Living Will?: Yes    Advance Directives       Power of Attorney Living Will ACP-Advance Directive ACP-Power of Attorney    Not on File Not on File Not on File Not on File        General Health Risk Interventions:  As above    Activity, Diet, and Weight:    Inactivity:  On average, how many days per week do you engage in moderate to strenuous exercise (like a brisk walk)?: 0 days (!) Abnormal  On average, how many minutes do you engage in exercise at this level?: 0 min    Abnormal BMI (obese):  Body mass index is 30.52 kg/m. (!) Abnormal      Health Habits/Nutrition Interventions:  As above           PREVENTIVE CARE -SCREENINGS  Colon cancer:  Due next year    Lung cancer (LDCT): n/a    Hepatitis C:  N/a    Diabetes:   UTD    Lipids:   UTD    AAA:   N/a      PREVENTIVE CARE - FEMALE SCREENINGS     Cervical cancer: completed    Breast cancer: UTD, per GYN    Osteoporosis: UTD        IMMUNIZATIONS     Immunization History   Administered Date(s) Administered    COVID-19, MODERNA BLUE border, Primary or Immunocompromised, (age 12y+), IM, 100 mcg/0.5mL 09/08/2019, 10/06/2019, 05/24/2020    Influenza Virus Vaccine 04/28/2019    Influenza, FLUAD, (age 82 y+), IM, Quadv, 0.37mL 04/29/2019     Pneumococcal, PCV20, PREVNAR 20, (age 6w+), IM, 0.93mL 02/26/2021    Pneumococcal, PPSV23, PNEUMOVAX 23, (age 2y+), SC/IM, 0.5mL 05/18/2018    TDaP, ADACEL (age 42y-64y), BOOSTRIX (age 10y+), IM, 0.32mL 05/11/2017    Zoster Recombinant (Shingrix) 05/23/2017, 08/05/2017        INDIVIDUALIZED SCREENING, EDUCATION, AND PLAN     The patient and any caregiver(s) were counseled on: Healthcare maintenance and preventive items as above., Healthy diet, maintaining a healthy weight, and getting at least 30 minutes of exercise most every day.    The patient  is not  on high risk medication(s) including benzodiaepines.    Based on my evaluation of the patient and the Health Risk Assessment performed today, there  is not  evidence of cognitive impairment.      Medications, allergies, problem list, previous encounters, recent results, medical, social and family history reviewed in the electronic health record. Medications and problems are viewable in the Encounter tab for this visit.    Current Outpatient Medications   Medication Sig Dispense Refill    hydroCHLOROthiazide  (HYDRODIURIL ) 25 MG tablet Take 1 tablet by mouth daily 90 tablet 3    losartan  (COZAAR ) 25 MG tablet Take 1 tablet by mouth daily 90 tablet 3    Cholecalciferol 50 MCG (2000 UT) CAPS Take 1 capsule by mouth daily      fluticasone (FLONASE) 50 MCG/ACT nasal spray 1 spray by Nasal route daily as needed      Red Yeast Rice Extract 600 MG CAPS Take 1,200 mg by mouth daily      triamcinolone (KENALOG) 0.1 % cream Apply 1 oz topically as needed       No current facility-administered medications for this visit.        Medications Discontinued During This Encounter   Medication Reason    losartan  (COZAAR ) 25 MG tablet REORDER    hydroCHLOROthiazide  (HYDRODIURIL ) 25 MG tablet REORDER        Allergies   Allergen Reactions    Adhesive Tape Rash     Bandaids    Amoxicillin Rash    Clindamycin Diarrhea       Past Medical History:   Diagnosis Date    Back pain     Bilateral  cataracts     Cancer (HCC)     melanoma removed from arm x2 per hx    Contusion of left breast     Diverticulitis     Epiretinal membrane     Bilateral    H/O seasonal allergies     Hyperlipidemia     Hypertension     Hypertensive disorder     Hypertensive retinopathy of both eyes     Hypokalemia     Ill-defined condition  1973 mono with jaundice    Ill-defined condition     May 2022, Covid+ mild case    Kidney stone     Liver disease     fatty liver    Macular drusen, bilateral     Nuclear senile cataract of both eyes     Osteopenia     Palpitations     PUD (peptic ulcer disease)     ? of over 20 yrs ago    Submandibular lymphadenopathy     Vitreous floaters             Procedure Laterality Date    CHOLECYSTECTOMY, LAPAROSCOPIC  01/29/2021    Laparoscopic cholecystectomy - Raynell Kristy Porto MD    COLONOSCOPY  03/07/2019    [facility] - Colonoscopy diverticulosis, polyp Oneil Rhyme     CYST REMOVAL  07/05/1981    pilonidal cyst    HYSTERECTOMY (CERVIX STATUS UNKNOWN)  2002    prolaped uterus fibroid    OVARY REMOVAL Bilateral 2002    prolapsed uterus    WISDOM TOOTH EXTRACTION              Problem Relation Age of Onset    Breast Cancer Mother 90        malignant tumor    Cataracts Mother         Bilateral    Diabetes Mother     Heart Disease Mother     Prostate Cancer Father         malignant    Macular Degen Father     Cataracts Father         Bilateral    Diabetes Father     Heart Disease Father     Colon Cancer Paternal Grandfather     Breast Cancer Maternal Aunt         58's with recurrance    Diabetes Other         Other family hx of    Other Other         Grandmother - malignant tumor of colon, Grandfather - Bilateral degeneration of macula        Social History     Socioeconomic History    Marital status: Married     Spouse name: Not on file    Number of children: Not on file    Years of education: Not on file    Highest education level: Not on file   Occupational History    Not on file    Tobacco Use    Smoking status: Never    Smokeless tobacco: Never   Substance and Sexual Activity    Alcohol use: Not Currently    Drug use: Never    Sexual activity: Yes     Partners: Male   Other Topics Concern    Not on file   Social History Narrative    Married  Two Chldren  Retired Runner, 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       Social Determinants of Health     Financial Resource Strain: Not on file   Food Insecurity: Not on file   Transportation Needs: Not on file   Physical Activity: Inactive (03/09/2023)    Exercise Vital Sign     Days of Exercise per Week: 0 days     Minutes of Exercise per Session: 0 min   Stress: Not on file   Social Connections: Not on file   Intimate Partner Violence: Not on file   Housing Stability: Not  on file           Patient Care Team:  Haruna Rohlfs J, MD as PCP - General  Kristy Porto, Raynell SQUIBB, MD as Surgeon  Carver-Bialer, Charlott Amble, APRN - NP as Referring Physician  Deborrah Suzen Pulling, MD (Obstetrics & Gynecology)  Rogue Curtistine RAMAN, MD (Ophthalmology)    No follow-up provider specified.  Future Appointments   Date Time Provider Department Center   03/20/2024  9:00 AM Anacaren Kohan J, MD [facility]         Dene JINNY Mood, MD, 03/09/23   This encounter has been electronically signed

## 2023-03-09 NOTE — Progress Notes (Signed)
Last AWV: 03/01/22    PREVENTIVE CARE - SCREENINGS  Colon cancer:  Up to date Colonoscopy done 03/07/19- Diverticulosis and hyperplastic polyp. Repeat 5 years.    Lung cancer: Not indicated Pt never smoked    Smoking Hx  reports that she has never smoked. She has never used smokeless tobacco.   Hepatitis C: Up to date Done 11/29/2014   Lipids:  Due Last done 02/09/22     AAA:  Not indicated No known family Hx       Diabetes: Due Last done 02/09/22     A1C No results found for: "LABA1C", "HBA1C", "HGBA1CEXT", "HBA1CPOC"   DM eye exam Pt non diabetic   DM foot exam There are no preventive care reminders to display for this patient.   Microalb No results found for: "MALB24HUR"     Breast Cancer: Up to date Mammogram done 09/27/22- Benign Finding- Repeat 1 year   Cervical Cancer:  Not indicated Pt age 10.   Osteoporosis: Up to date Dexa done 06/19/21- Osteopenia- Repeat 2 years     Influenza: Due Done 05/17/21 per [facility]   Pneumonia: Up to date 23 done 05/18/18, 20 done 02/26/21   Shingrix: Up to date 2 doses given   Td/Tdap: Up to date Tdap given 05/11/17   COVID 19: Due 3 doses given.     RSV Due None found.          Active orders for [facility] gaps: A1c, Lipid Panel, CMP  HIN reviewed: Yes

## 2023-03-23 NOTE — Telephone Encounter (Addendum)
 Caller and Callback #: patient and Call back #: [phone]  Complaint:   Chief Complaint   Patient presents with    left arm swelling/hardness       Patient reports last October, her left arm swelled up and they never figured out why.    She states my left arm is beginning to swell again with slight firmness, just like last year.  The arm is swelling from the elbow down, she says.    She says she feels fine otherwise, and states she is currently washing her car as she is talking to me.    She says there is no numbness, pain, or weakness in the arm and she doesn't feel that this is an allergic reaction of any type, as she has not had any new foods, new medicine, and has not been stung by any insect.    She thought she better reach out where she just noticed this happening, before it gets worse.    She says if Dr Linward cannot see her she will see another provider of his choosing, in office.    Please advise.        RED FLAGS - If Yes, transfer call to MA  Chest pain, Chest Burning, Chest Discomfort or pressure not related to coughing [] Y  [x] N   Reports struggling to breathe   [] Y  [x] N   Unable to speak in full sentences due to difficulty breathing [] Y  [x] N   Passed out or fainted today [] Y  [x] N   New numbness, weakness, slurred speech, confusion or vision loss [] Y  [x] N     Facial Drooping [] Y  [x] N             Disposition:  [] Transferred to MA  [] Note routed to MA pool as Urgent  [] Appt today with PCP  [] Appt today with other provider  [] Appt within 7 days with PCP  [] Appt within 7 days with other provider  [] Unable to schedule. Comments required:   [] Patient declined appointment. Comments required:

## 2023-03-23 NOTE — Telephone Encounter (Signed)
 Patient contacted and informed of PCP message. Patient scheduled with Shanda Bumps, tomorrow at 0900. No other questions or concerns at this time. Thank you.

## 2023-03-23 NOTE — Telephone Encounter (Signed)
 Yes, please schedule her for an OV so that we can reevaluate.  We may need to do a MRI this time around as the CT was unrevealing.

## 2023-03-24 ENCOUNTER — Ambulatory Visit: Admit: 2023-03-24 | Discharge: 2023-03-24 | Payer: MEDICARE | Primary: Family Medicine

## 2023-03-24 DIAGNOSIS — M7989 Other specified soft tissue disorders: Secondary | ICD-10-CM

## 2023-03-24 NOTE — Progress Notes (Signed)
 [facility]   [address]  [place] [id number]    CHIEF COMPLAINT     Katherine Osborne is a 72 y.o. female who presents for an acute visit due to Swelling (L forearm Arm swelling. She states no pain just very stiff. Started yesterday morning. She states this happened in October of last year. )    HPI   Lower left arm swelling last October after dermatological procedure   Went away on own   CT completed No suspicious fluid collection or findings to suggest soft tissue abscess. Area of irregularity along the skin surface in the region marked abnormality along the lateral aspect of distal upper extremity above and at the elbow joint space level. Likely related to sequelae of operative/interventional procedures. If their remains ongoing clinical suspicion consider MRI for further assessment.   Reoccurrence of swelling started yesterday morning, woke up with it, originally thought she may have slept on arm incorrectly but didn't improve  Started distal arm below elbow and moved down forearm on palmer side   Chronic nodule present from scar tissue, feels slightly larger   Can't get ring off today   Once in awhile tingling occurs   No pain, feels stiff   No weakness   No new injuries, falls, accidents   Melanoma twice on that arm   No new skin lesions  Dermatology appt next month   No inject bites, tick bites  No rashes, redness, or erythema   Tried icing   Review of Systems  See HPI   PHYSICAL EXAM   Physical Exam  Constitutional:       General: She is not in acute distress.     Appearance: Normal appearance.   Cardiovascular:      Rate and Rhythm: Normal rate.      Pulses: Normal pulses.           Radial pulses are 2+ on the right side and 2+ on the left side.   Pulmonary:      Effort: Pulmonary effort is normal.   Musculoskeletal:        Arms:       Comments: Left arm with swelling concentrated at palmer side of proximal forearm, no erythema or ecchymosis, small nodule noted lateral  side below elbow joint, she reports chronic scar tissue but feels slightly bigger.ROM, strength, and sensation intact. 2+ radial pulse. Skin pink and warm.    Skin:     General: Skin is warm and dry.      Capillary Refill: Capillary refill takes less than 2 seconds.   Neurological:      Mental Status: She is alert and oriented to person, place, and time.   Psychiatric:         Mood and Affect: Mood normal.         Behavior: Behavior normal.         Thought Content: Thought content normal.         Judgment: Judgment normal.        No results found for any visits on 03/24/23.   MEDICATIONS     Current Outpatient Medications   Medication Sig    hydroCHLOROthiazide  (HYDRODIURIL ) 25 MG tablet Take 1 tablet by mouth daily    losartan  (COZAAR ) 25 MG tablet Take 1 tablet by mouth daily    Cholecalciferol 50 MCG (2000 UT) CAPS Take 1 capsule by mouth daily    fluticasone (FLONASE) 50 MCG/ACT  nasal spray 1 spray by Nasal route daily as needed    Red Yeast Rice Extract 600 MG CAPS Take 1,200 mg by mouth daily    triamcinolone (KENALOG) 0.1 % cream Apply 1 oz topically as needed     No current facility-administered medications for this visit.     There are no discontinued medications.    ALLERGIES     Allergies   Allergen Reactions    Adhesive Tape Rash     Bandaids    Amoxicillin Rash    Clindamycin Diarrhea     ACTIVE MEDICAL PROBLEMS     Patient Active Problem List   Diagnosis    Medicare annual wellness visit, subsequent    Preventative health care    Essential hypertension    Fatty liver    Symptomatic cholelithiasis    Encounter for immunization    Left arm swelling    Mixed hyperlipidemia    Class 1 obesity without serious comorbidity with body mass index (BMI) of 30.0 to 30.9 in adult    Prediabetes     SOCIAL HISTORY     Social History     Social History Narrative    Married  Two Chldren  Retired Runner, 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    LABS & IMAGING   No results found for this or any previous visit (from the past 24 hour(s)).  ASSESSMENT AND PLAN     1. Left arm swelling  -     MRI HUMERUS LEFT W CONTRAST; Future  Reoccurrence of soft tissue swelling left forearm   First occurrence last Oct post cryo to derm lesion   CT scan unrevealing at that time   No signs of arterial compromise or signs of infection on exam. Sensation, strength, and ROM intact   Will obtain MRI to further evaluate    Recommended continuing ice application, OTC ibuprofen as needed for discomfort and to aid in inflammation reduction.     Follow up:  Return if symptoms worsen or fail to improve.     Future Appointments   Date Time Provider Department Center   04/18/2023  9:30 AM [facility] MRI 1 [facility] RAD MRI [facility]   03/20/2024  9:15 AM Szylvian, Chadwick J, MD [facility]       Harlene CHRISTELLA Comer, FNP  03/24/2023

## 2023-04-06 ENCOUNTER — Encounter

## 2023-04-14 ENCOUNTER — Telehealth

## 2023-04-14 NOTE — Telephone Encounter (Signed)
New order placed. Can we cancel previous order? Thank you.

## 2023-04-14 NOTE — Telephone Encounter (Signed)
New order signed.   Thank you!

## 2023-04-14 NOTE — Telephone Encounter (Signed)
Katherine Osborne  You1 minute ago (2:36 PM)     CD  Sorry, gave the wrong IMG code.  Should be [id number] for MRI left elbow w/wo contrast.  I have unlinked the orders.         Can you place another new order please. New code is above.

## 2023-04-14 NOTE — Telephone Encounter (Signed)
[facility] Clinical Staff  Pt scheduled 10/14 for MRI humerus.  CT of humerus in 2023 was basically the elbow area.  Please place new order for MRI elbow w/wo contrast.   [id number]    Jess- can you please place new order.

## 2023-04-18 ENCOUNTER — Inpatient Hospital Stay: Admit: 2023-04-18 | Payer: MEDICARE | Primary: Family Medicine

## 2023-04-18 DIAGNOSIS — M7989 Other specified soft tissue disorders: Secondary | ICD-10-CM

## 2023-04-18 MED ORDER — GADOBUTROL 1 MMOL/ML IV SOLN
1 | Freq: Once | INTRAVENOUS | Status: AC | PRN
Start: 2023-04-18 — End: 2023-04-18
  Administered 2023-04-18: 14:00:00 7 mL via INTRAVENOUS

## 2023-04-18 MED FILL — GADAVIST 1 MMOL/ML IV SOLN: 1 MMOL/ML | INTRAVENOUS | Qty: 7

## 2023-04-26 NOTE — Telephone Encounter (Signed)
Can we let Zetha know that MRI of left elbow was unremarkable. Has the soft tissue swelling improved?

## 2023-04-26 NOTE — Telephone Encounter (Signed)
Noted, to provider as fyi.

## 2023-04-26 NOTE — Telephone Encounter (Signed)
Pt called back and I relayed the above message, they understood and didn't have any additional questions at this time.  Swelling is gone. No pain.

## 2023-04-26 NOTE — Telephone Encounter (Signed)
Called patient, left message for call back.  If patient calls back it is ok for non clinical staff to relay the below message exactly as written.

## 2023-08-03 ENCOUNTER — Encounter

## 2023-09-28 ENCOUNTER — Inpatient Hospital Stay: Admit: 2023-09-28 | Payer: MEDICARE | Primary: Family Medicine

## 2023-09-28 DIAGNOSIS — Z1231 Encounter for screening mammogram for malignant neoplasm of breast: Secondary | ICD-10-CM

## 2023-10-25 IMAGING — US US BREAST BX W LOC DEV 1ST LESION IMG BX SPEC US GUIDE*L*
1 series · 11 of 11 positions shown · non-contrast
Comparison: Previous exam(s).
COMPARISON: Previous exam(s).

Addendum:
CLINICAL DATA: 70-year-old female presenting for ultrasound-guided
biopsy of a left breast duct.

EXAM:
ULTRASOUND GUIDED LEFT BREAST CORE NEEDLE BIOPSY

[Series 1: us breast bx w loc dev 1st lesion img bx spec us g · 0.05mm/px · 11 of 11 slices shown]
[im 1/11]
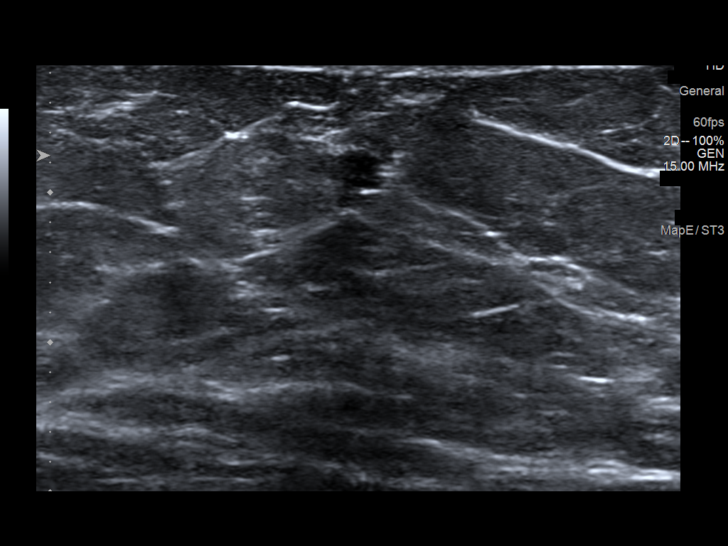
[im 2/11]
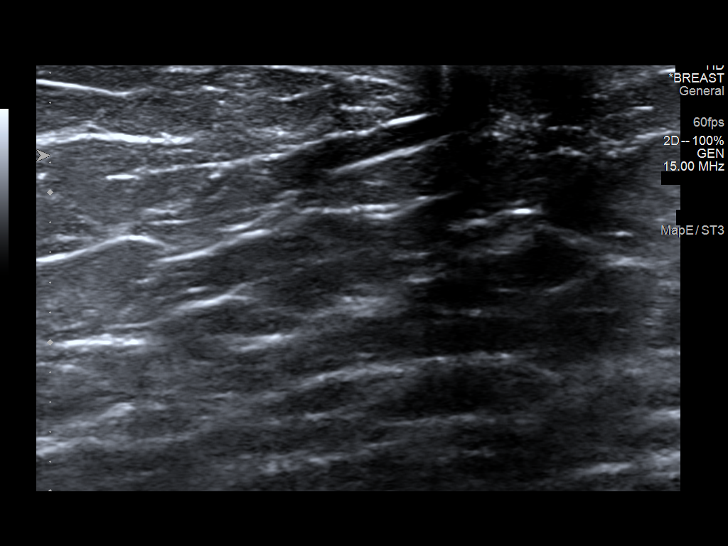
[im 3/11]
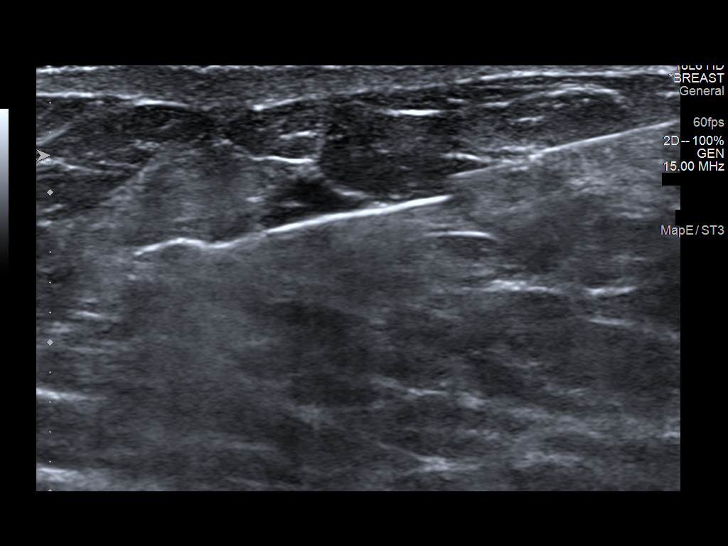
[im 4/11]
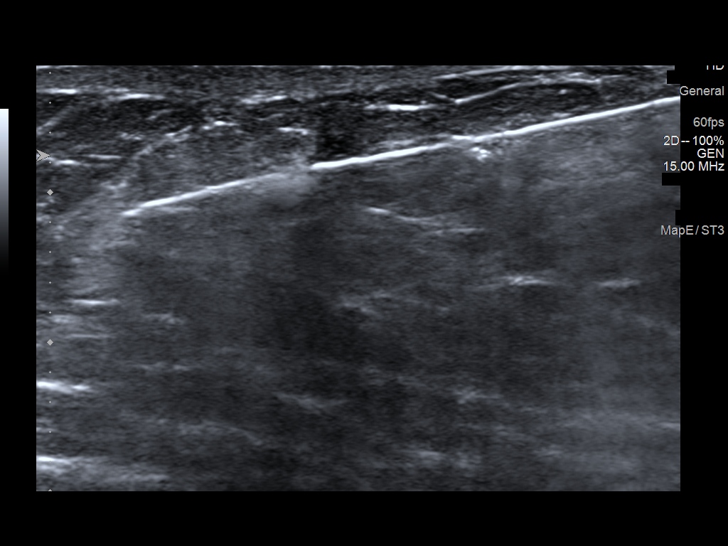
[im 5/11]
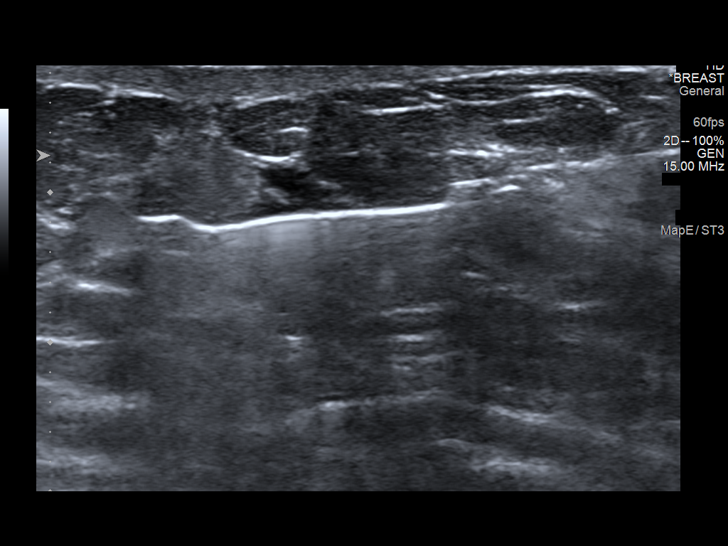
[im 6/11]
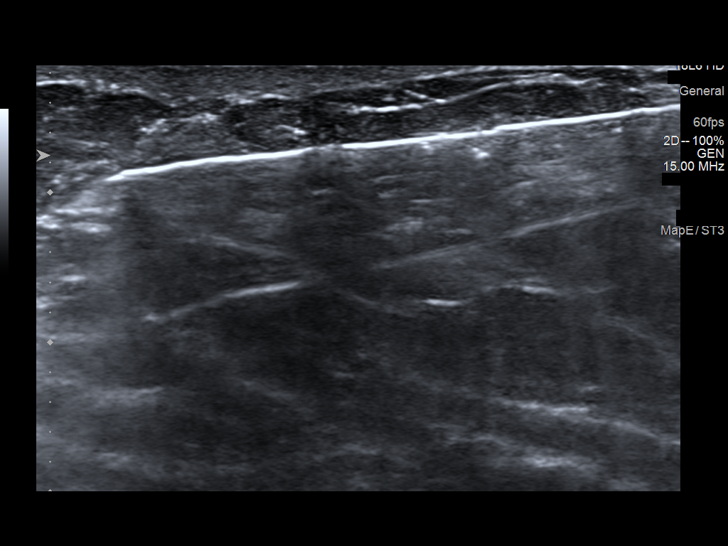
[im 7/11]
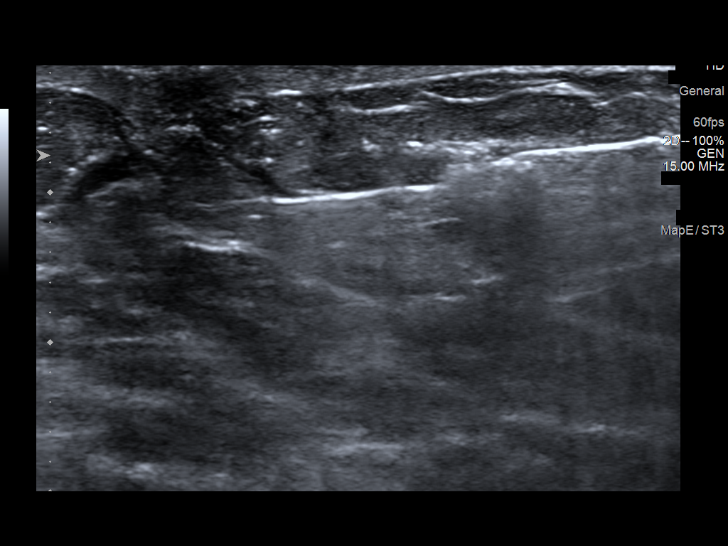
[im 8/11]
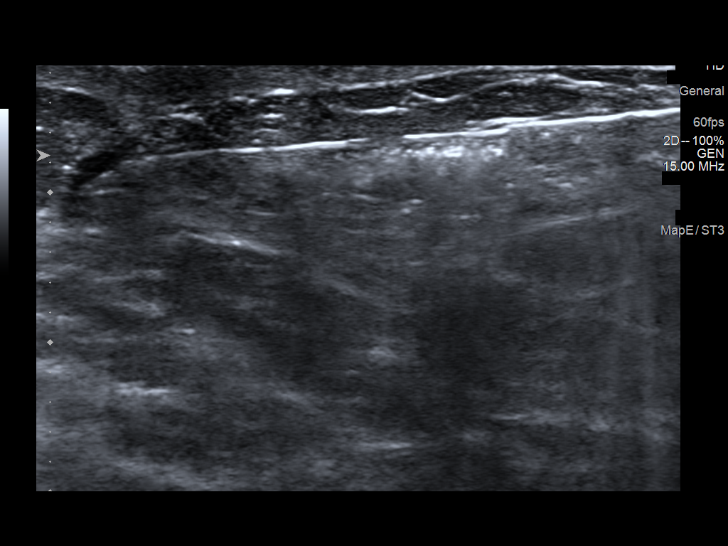
[im 9/11]
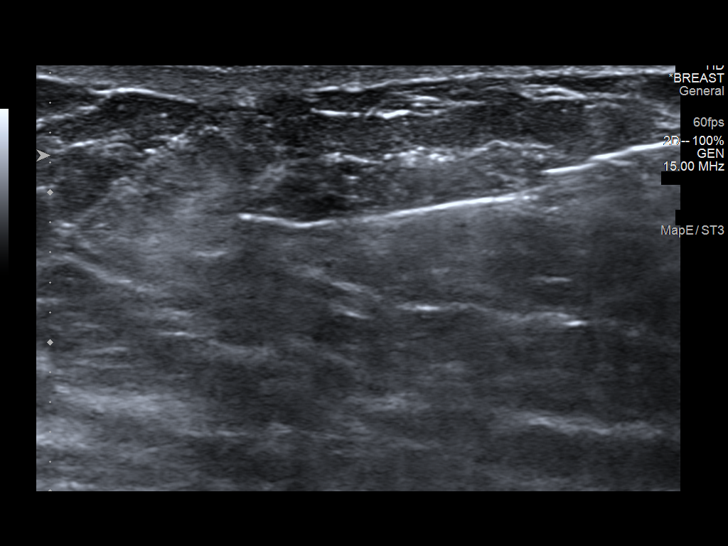
[im 10/11]
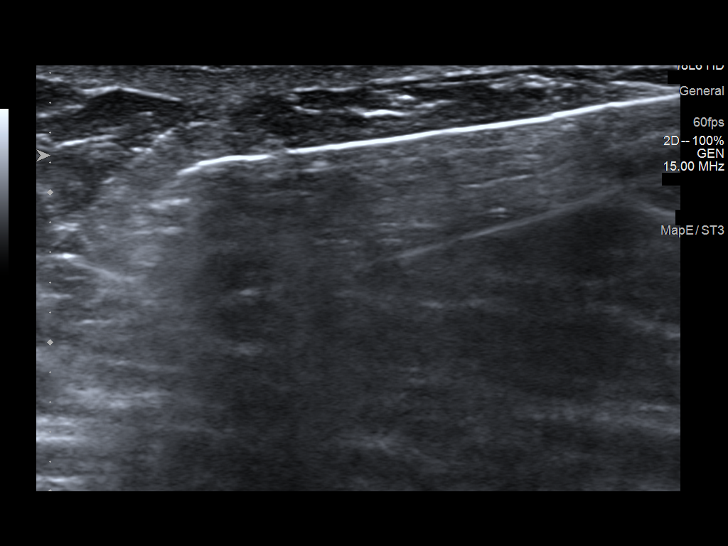
[im 11/11]
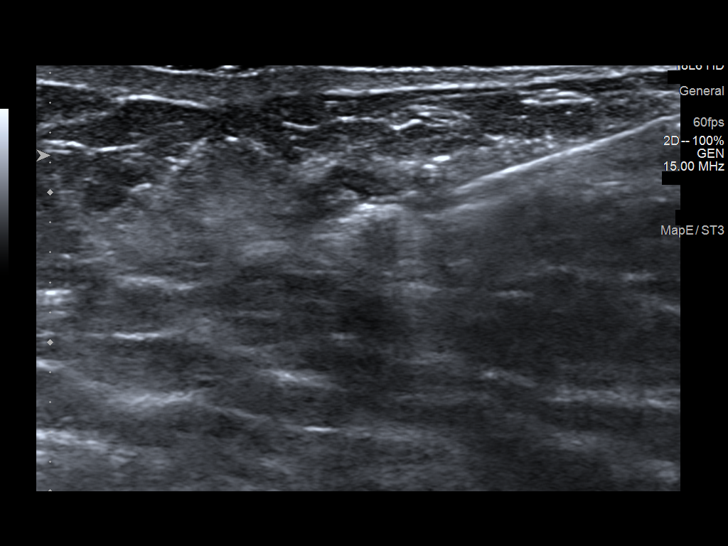

[11 of 11 positions shown; findings below may reference images not displayed]



Lesion quadrant: Upper outer quadrant

Using sterile technique and 1% Lidocaine as local anesthetic, under
direct ultrasound visualization, a 14 gauge Rhitik device was
used to perform biopsy of a retroareolar duct in the left breast at
12 o'clock using a superolateral approach. At the conclusion of the
procedure a ribbon shaped tissue marker clip was deployed into the
biopsy cavity. Follow up 2 view mammogram was performed and dictated
separately.
IMPRESSION: Ultrasound guided biopsy of a duct in the retroareolar left breast
at 12 o'clock. No apparent complications.

ADDENDUM:
Pathology revealed BENIGN DILATED DUCT- NO ATYPIA OR MALIGNANCY of
the LEFT breast retroareolar, 12 o'clock (ribbon clip). This was
found to be concordant by Dr. Yusein Mesure Pantova.

Pathology results were discussed with the patient by telephone. The
patient reported doing well after the biopsy with tenderness at the
site. Post biopsy instructions and care were reviewed and questions
were answered. The patient was encouraged to call The [REDACTED]

The patient was instructed to return for annual screening
mammography and informed a reminder notice would be sent regarding
this appointment.

Pathology results reported by Rtoyota Joshjax RN on 07/23/2021.



Lesion quadrant: Upper outer quadrant

Using sterile technique and 1% Lidocaine as local anesthetic, under
direct ultrasound visualization, a 14 gauge Rhitik device was
used to perform biopsy of a retroareolar duct in the left breast at
12 o'clock using a superolateral approach. At the conclusion of the
procedure a ribbon shaped tissue marker clip was deployed into the
biopsy cavity. Follow up 2 view mammogram was performed and dictated
separately.
IMPRESSION: Ultrasound guided biopsy of a duct in the retroareolar left breast
at 12 o'clock. No apparent complications.

## 2023-10-25 IMAGING — MG MM BREAST LOCALIZATION CLIP
4 series · 4 of 12 positions shown · non-contrast
Comparison: Previous exam(s).

CLINICAL DATA: Post biopsy mammogram of the left breast for clip
placement.

EXAM:
3D DIAGNOSTIC LEFT MAMMOGRAM POST ULTRASOUND BIOPSY

[L ML synth-2D]
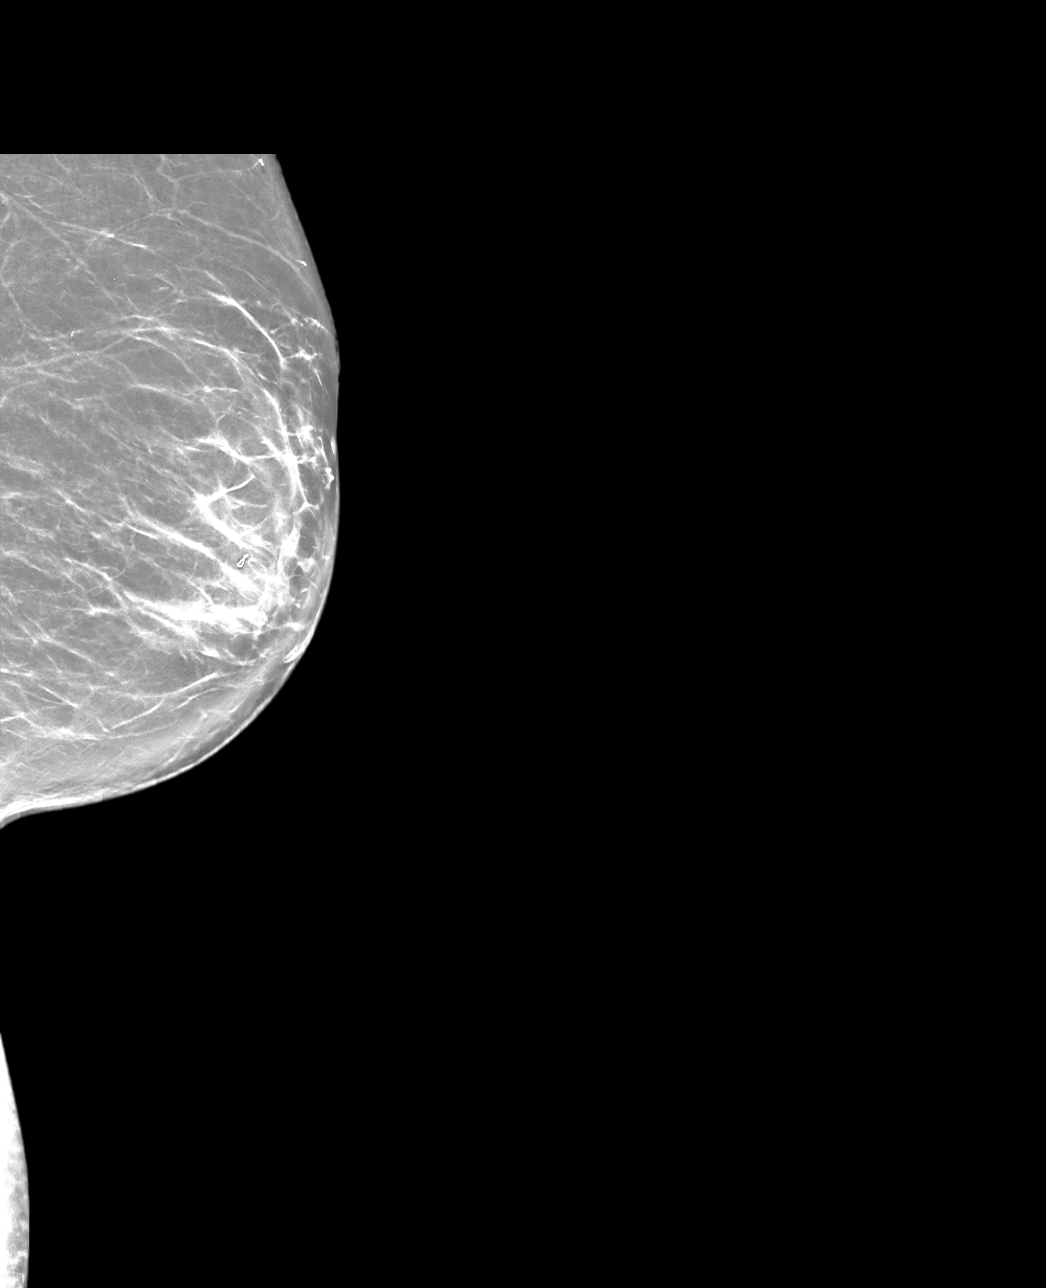

[L CC synth-2D]
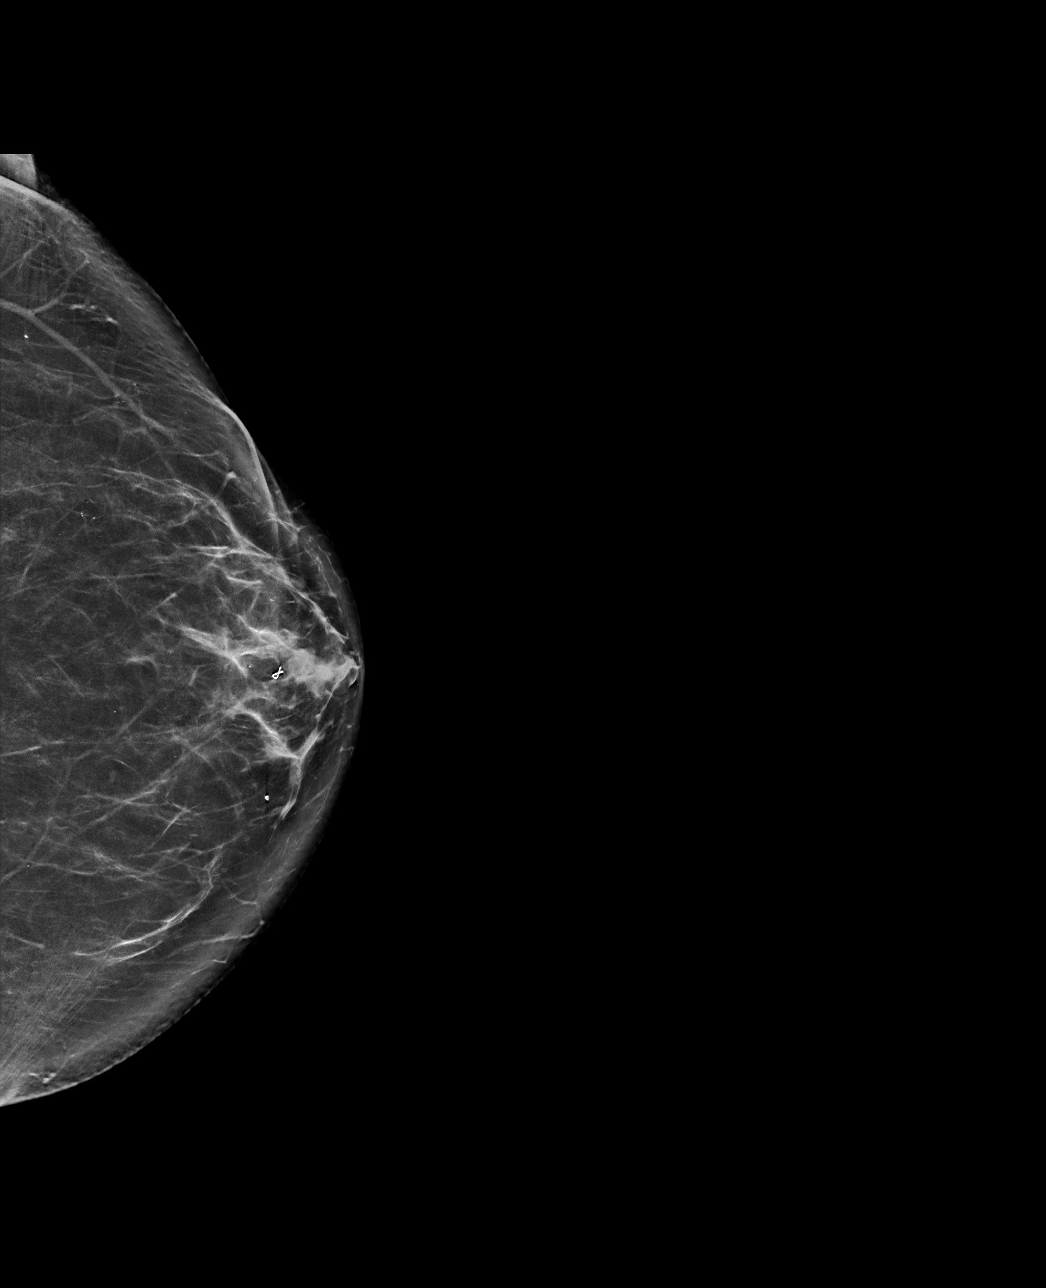

[L CC tomo · tomo slice 40/79.0]
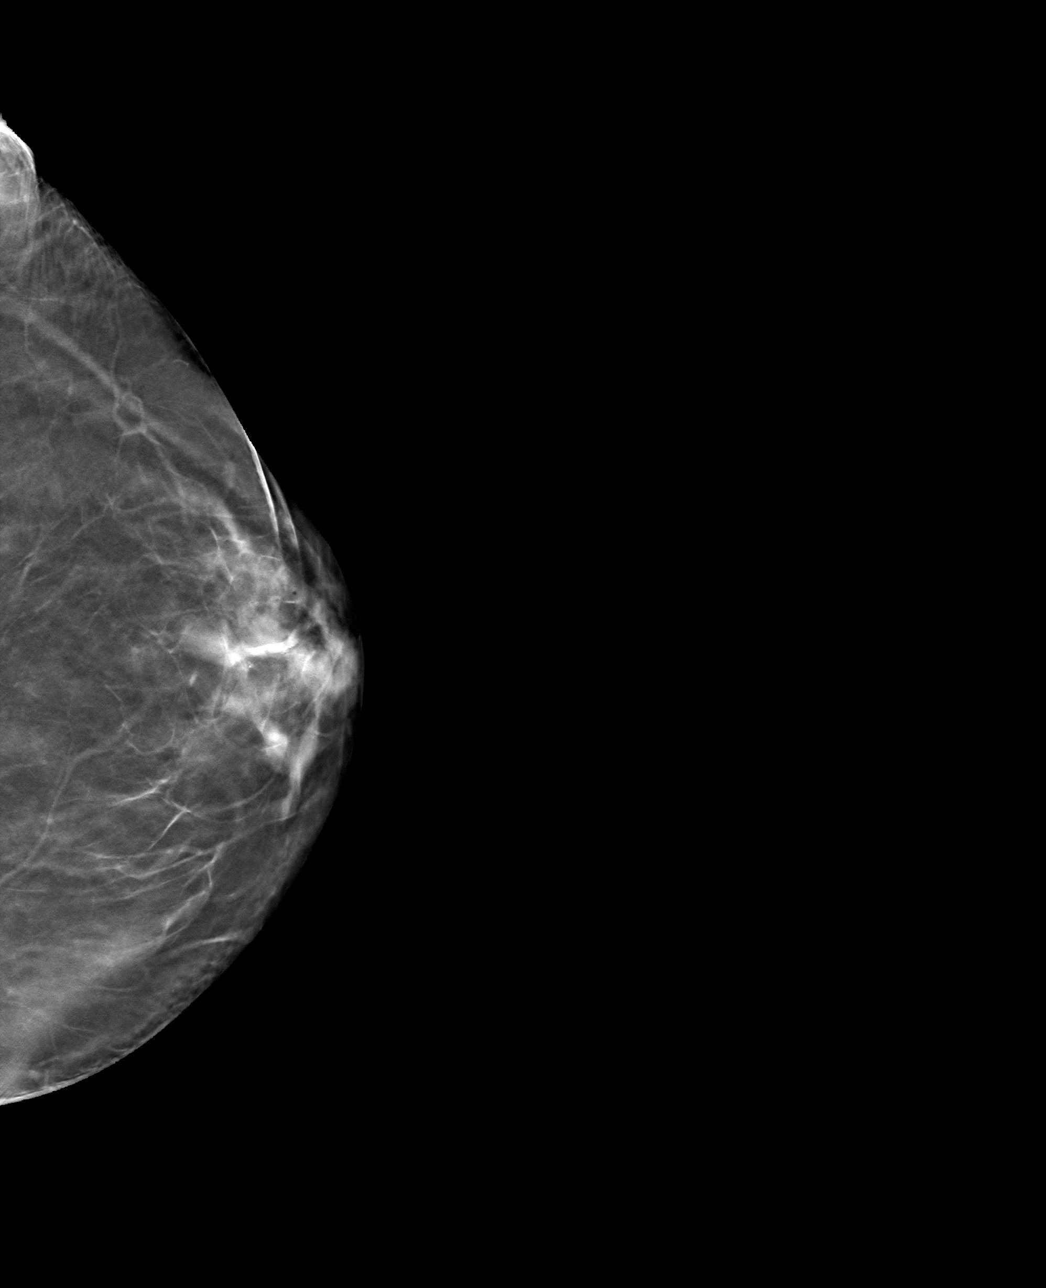

[L ML tomo · tomo slice 41/80.0]
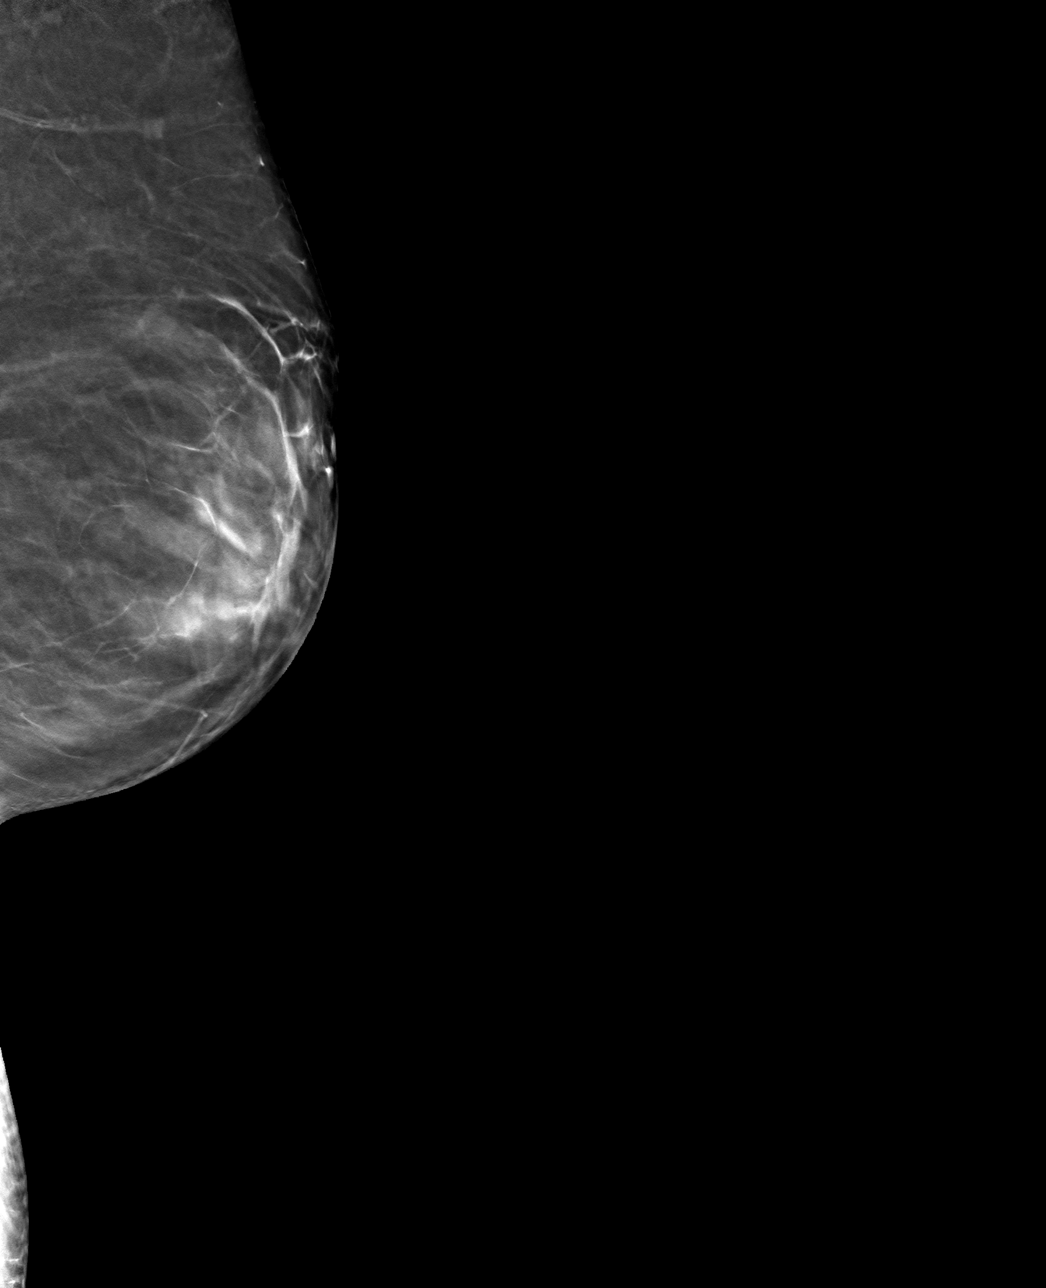

[4 of 12 positions shown; findings below may reference images not displayed]

FINDINGS: 3D Mammographic images were obtained following ultrasound guided
biopsy of a duct in the superior retroareolar left breast. The
biopsy marking clip is in expected position at the site of biopsy.
IMPRESSION: Appropriate positioning of the ribbon shaped biopsy marking clip at
the site of biopsy in the retroareolar left breast.

Final Assessment: Post Procedure Mammograms for Marker Placement

## 2023-12-19 ENCOUNTER — Encounter

## 2023-12-19 MED ORDER — LOSARTAN POTASSIUM 25 MG PO TABS
25 | ORAL_TABLET | Freq: Every day | ORAL | 3 refills | 90.00000 days | Status: AC
Start: 2023-12-19 — End: ?

## 2023-12-19 NOTE — Telephone Encounter (Signed)
 Patient's name and date of birth verified at start of call.   Incoming refill request.  Caller has been notified that we require a minimum of 2 business days for refill processing. (This does not include weekends, holidays, etc.)      Katherine Osborne  [birth year]      Confirmed best contact number:   Home Phone [phone] (home)    Medications Requested:  Requested Prescriptions     Pending Prescriptions Disp Refills    losartan  (COZAAR ) 25 MG tablet 90 tablet 3     Sig: Take 1 tablet by mouth daily       Preferred Pharmacy:   [id number] [place], [place] - [address] - P [phone] - F [phone]  [address]  [place] [address]  Phone: [phone] Fax: [phone]    Has patient already contacted pharmacy to confirm no refills were on file: Yes    Notes for office regarding medication request:    Patient is out of medication. Sending urgently      Other instructions and notes:  Last visit with provider: 03/09/2023  Next scheduled visit with provider: 03/20/2024  *If next visit is not scheduled, book next needed visit or document if recall was added to list prior to sending for processing*  Inform patient that this needs to be on file before sending request as we do require for them to remain up-to-date with their recommended healthcare in order to avoid any interruptions in our ability to provide ongoing care such as refills.     Patient MyChart Status:  For Active Patients - Patient has been notified that they will receive an automated notification via MyChart once their script has been processed.   For Inactive Patients - Patient is aware that we have a patient portal, MyChart, which offers many benefits such as being able to request their refills electronically and receive automated notifications when scripts are processed.  In addition, they can schedule and manage appointments, view their testing results and visit notes, and stay connected with their care team.  Patient offered  MyChart today.  Patient active.  (Send link for set up if accepted)

## 2023-12-19 NOTE — Telephone Encounter (Signed)
 Medications Requested:  Requested Prescriptions     Pending Prescriptions Disp Refills    losartan  (COZAAR ) 25 MG tablet 90 tablet 3     Sig: Take 1 tablet by mouth daily       Preferred Pharmacy:   [id number] - [place], [place] - [address] - P [phone] - F [phone]  [address]  [place] [address]  Phone: [phone] Fax: [phone]      Date of Last Refill: 03/09/23 90 tabs 3 ref to [employer] for business. Pt req new pharmacy.    Prescription Refill Protocol reviewed: Yes   losartan  (COZAAR ) 25 MG tablet         Sig: Take 1 tablet by mouth daily    Disp: 90 tablet    Refills: 3    Start: 12/19/2023    Class: Normal    Non-formulary For: Essential hypertension    Last ordered: 9 months ago (03/09/2023) by Loreta Rome, MD    ARB Refill Protocol Failed06/16/2025 01:59 PM   Protocol Details Last creatinine level resulted within the past 12 months    Last potassium level normal, within the past 12 months    Visit with authorizing provider in past 9 months or upcoming 90 days      Abstracted cmp results from 02/22/23  Was seen at internal med on 03/24/23 - less than 9 months ago, also saw pcp on 03/09/23  Refill protocol passed    Allergy List Reviewed and Verified: Yes    Possible medication to medication interactions reviewed: Yes    Last appt @ PCP Office: 03/24/2023     Future Appointments   Date Time Provider Department Center   03/20/2024  9:15 AM Loreta Rome, MD [facility]       MOST RECENT BLOOD PRESSURES  BP Readings from Last 3 Encounters:   03/24/23 130/76   03/09/23 128/74   12/08/22 122/70         MOST RECENT LAB DATA  Lab Results   Component Value Date/Time    CREATEXT 0.85 07/13/2019 12:00 AM    POTEXT 3.4 07/13/2019 12:00 AM    SGPTALTEXT 25 07/13/2019 12:00 AM    TOTCHOLEXT 221 07/13/2019 12:00 AM

## 2024-03-08 NOTE — Progress Notes (Signed)
 Last AWV:03/09/23    PREVENTIVE CARE - SCREENINGS  Breast Cancer: Up to date Date of last Mammogram: 09/28/2023 Benign mammogram, repeat 1 yr   Cervical Cancer:  No cervical cancer screening on file   Cervical Cancer: Not indicated Pt age 73   Colon cancer:  Due Date of last Colonoscopy: 03/07/2019 Colonic mucosa, diverticulosis, repeat 5 yrs, difficult exam   Lung cancer: Not indicated Pt non smoker   Smoking Hx  reports that she has never smoked. She has never used smokeless tobacco.   AAA:  Not indicated No known family history,  non smoker   Osteoporosis: DEXA Result (most recent):  DEXA BONE DENSITY AXIAL SKELETON 06/19/2021    Narrative  EXAM:  DEXA BONE DENSITY AXIAL SKELETON    INDICATION:  ASYMPTOMATIC MENOPAUSAL STATE; OTHER SPECIFIED DISORDERS OF BONE DENSITY AND STRUCTURE, UNSPECIFIED SITE; POSTMENOPAUSAL , R/O OSTEOPOROSIS LAST ONE 07/02/19; .    COMPARISON:  June 12, 2019    TECHNIQUE:  Region of study: Spine, left hip. Bone mineral density evaluation was performed employing dual-energy absorptiometry technique.    FINDINGS:  LUMBAR SPINE (L1-L4 TOTAL):    Bone Density: 1.022 g/cm2    T-score: -0.2    Comparison: Increased 1.9%    Classification: Normal lumbar spine bone density.    HIP TOTAL:    Bone Density: 0.976 g/cm2    T-score: 0.3    Comparison: Increase 5.8%    Classification: Normal total hip bone density.    FEMORAL NECK:    Bone Density: 0.689 g/cm2    T-score: -1.4    Classification: Low bone mass (formerly osteopenia).    FRAX:    10 year probability of fracture: (NOT applicable in treated patients or pre-menopausal women)    Major osteoporotic fracture: 9.4%    Hip fracture: 1.2%    According to the most recent 2015 official positions of the International Society for Clinical Densitometry (ISCD), T-scores are utilized for bone mineral density measurements in peri-menopausal and post-menopausal women and in men age 33 and older with the following parameters:    T-score within +1.0 and -1.0 =  within expected range    T-score is less than -1.0 but greater than -2.5 = low bone density (formerly osteopenia)    T-score is less than or equal to -2.5 = osteoporosis    Impression  According to the 2015 ISCD reporting criteria, overall bone density measurements are compatible with low bone mass (formerly osteopenia).    Comparison to prior bone density values are provided above.    Recommendations for treatment (from [facility] Osteoporosis Foundation)    Pharmacologic treatment should be considered in post-menopausal women and men 58 or older:    - with history of hip or vertebral fracture, or    - with T-score < -2.5 hip or spine when secondary causes have been excluded, or    - with low bone mass when FRAX score > 20% major fracture or > 3% hip fracture.    Recommendations for follow-up DXA (from [facility] Osteoporosis Foundation):    2 years or longer for routine surveillance or stable therapeutic effect    1 year or sooner for new therapy, therapy change, high risk disease, or high risk medication    A medical evaluation for secondary causes of abnormal bone mineral density may be appropriate.     Osteoporosis: Up to date Osteopenia     Influenza: Due Done 05/17/21   Pneumonia: Up to date Done 20-8/25/22, 23-11/14/19   Shingrix:  Up to date Done 08/05/17, 05/23/17   Td/Tdap: Up to date Done 05/11/17 Tdap   COVID 19: Due 5 doses, most recent 03/09/23   RSV Due None found     Hepatitis C: Up to date 11/29/14 Non-Reactive   Lipids:  No results found for: CHOL, TRIG, HDL, LDL, VLDL, CHOLHDLRATIO 02/22/23 Ryno-795 high, Trig 235 high, HDL 47, LDL 110.0, Non HDL 157     Diabetes: Due Pt non diabetic, needs A1C  Lab Results   Component Value Date    NA 139 02/24/2023    K 3.6 02/24/2023    CL 103 02/24/2023    CO2 28 02/24/2023    BUN 16 02/24/2023    CREATININE 0.89 02/24/2023    GLUCOSE 118 (A) 02/24/2023    CALCIUM 9.4 02/24/2023    BILITOT 0.9 02/24/2023    ALKPHOS 53 02/24/2023    AST 19 02/24/2023    ALT 22  02/24/2023    LABGLOM >60 02/24/2023      Fasting BS 02/09/22@7 :55 Glu-103 high, 01/13/21@7 :45 Glu-101 high   A1C No results found for: LABA1C, HBA1C, HGBA1CEXT, HBA1CPOC   DM eye exam No diabetic eye exam on file   DM foot exam There are no preventive care reminders to display for this patient.   Microalb No components found for: LABMICR, MALB24HUR       Active orders for HM gaps:None  HIN reviewed: Yes

## 2024-03-12 ENCOUNTER — Telehealth

## 2024-03-12 NOTE — Telephone Encounter (Signed)
 Labs ordered.  Please fax.  Fasting please.

## 2024-03-12 NOTE — Telephone Encounter (Signed)
 Katherine Osborne would like to have her labs done before her upcoming annual visit on Sept 16 and would like them faxed to [facility].    Please call her and let her know if these are fasting labs or not, and when they have been placed.

## 2024-03-12 NOTE — Telephone Encounter (Signed)
 Called pt, she is aware. She verbalized understanding and had no further questions. I have faxed.

## 2024-03-14 LAB — COMPREHENSIVE METABOLIC PANEL
ALT: 26 U/L
AST: 23 U/L
Albumin: 3.9 g/dL
Alkaline Phosphatase: 59 U/L
Anion Gap: 9 mmol/L
BUN: 17 mg/dL
CO2: 26 mmol/L
Calcium: 9.3 mg/dL
Chloride: 102 mmol/L
Creatinine: 0.8 mg/dL
Est, Glom Filt Rate: 60
Glucose: 105 mg/dL — AB (ref 75–99)
Potassium: 3.3 mmol/L — AB (ref 3.5–5.1)
Sodium: 137 mmol/L
Total Bilirubin: 1.2 mg/dL (ref 0.1–1.4)
Total Protein: 6.2 g/dL — AB (ref 6.4–8.3)

## 2024-03-20 ENCOUNTER — Ambulatory Visit
Admit: 2024-03-20 | Discharge: 2024-03-20 | Payer: Medicare (Managed Care) | Attending: Family Medicine | Primary: Family Medicine

## 2024-03-20 NOTE — Patient Instructions (Signed)
 Labs look good  Ordered colonoscopy  Try Voltaren gel (diclofenac) for your sore feet; keep doing exercises  Consider some OTC zyrtec or claritin for fluid in your ears

## 2024-03-20 NOTE — Progress Notes (Signed)
 Last AWV:03/09/23    PREVENTIVE CARE - SCREENINGS  Breast Cancer: Up to date Date of last Mammogram: 09/28/2023 Benign mammogram, repeat 1 yr   Cervical Cancer:  No cervical cancer screening on file   Cervical Cancer: Not indicated Pt age 73   Colon cancer:  Due Date of last Colonoscopy: 03/07/2019 Colonic mucosa, diverticulosis, repeat 5 yrs, difficult exam   Lung cancer: Not indicated Pt non smoker   Smoking Hx  reports that she has never smoked. She has never used smokeless tobacco.   AAA:  Not indicated No known family history,  non smoker   Osteoporosis: DEXA Result (most recent):  DEXA BONE DENSITY AXIAL SKELETON 06/19/2021    Narrative  EXAM:  DEXA BONE DENSITY AXIAL SKELETON    INDICATION:  ASYMPTOMATIC MENOPAUSAL STATE; OTHER SPECIFIED DISORDERS OF BONE DENSITY AND STRUCTURE, UNSPECIFIED SITE; POSTMENOPAUSAL , R/O OSTEOPOROSIS LAST ONE 07/02/19; .    COMPARISON:  June 12, 2019    TECHNIQUE:  Region of study: Spine, left hip. Bone mineral density evaluation was performed employing dual-energy absorptiometry technique.    FINDINGS:  LUMBAR SPINE (L1-L4 TOTAL):    Bone Density: 1.022 g/cm2    T-score: -0.2    Comparison: Increased 1.9%    Classification: Normal lumbar spine bone density.    HIP TOTAL:    Bone Density: 0.976 g/cm2    T-score: 0.3    Comparison: Increase 5.8%    Classification: Normal total hip bone density.    FEMORAL NECK:    Bone Density: 0.689 g/cm2    T-score: -1.4    Classification: Low bone mass (formerly osteopenia).    FRAX:    10 year probability of fracture: (NOT applicable in treated patients or pre-menopausal women)    Major osteoporotic fracture: 9.4%    Hip fracture: 1.2%    According to the most recent 2015 official positions of the International Society for Clinical Densitometry (ISCD), T-scores are utilized for bone mineral density measurements in peri-menopausal and post-menopausal women and in men age 33 and older with the following parameters:    T-score within +1.0 and -1.0 =  within expected range    T-score is less than -1.0 but greater than -2.5 = low bone density (formerly osteopenia)    T-score is less than or equal to -2.5 = osteoporosis    Impression  According to the 2015 ISCD reporting criteria, overall bone density measurements are compatible with low bone mass (formerly osteopenia).    Comparison to prior bone density values are provided above.    Recommendations for treatment (from [facility] Osteoporosis Foundation)    Pharmacologic treatment should be considered in post-menopausal women and men 58 or older:    - with history of hip or vertebral fracture, or    - with T-score < -2.5 hip or spine when secondary causes have been excluded, or    - with low bone mass when FRAX score > 20% major fracture or > 3% hip fracture.    Recommendations for follow-up DXA (from [facility] Osteoporosis Foundation):    2 years or longer for routine surveillance or stable therapeutic effect    1 year or sooner for new therapy, therapy change, high risk disease, or high risk medication    A medical evaluation for secondary causes of abnormal bone mineral density may be appropriate.     Osteoporosis: Up to date Osteopenia     Influenza: Due Done 05/17/21   Pneumonia: Up to date Done 20-8/25/22, 23-11/14/19   Shingrix:  Up to date Done 08/05/17, 05/23/17   Td/Tdap: Up to date Done 05/11/17 Tdap   COVID 19: Due 5 doses, most recent 03/09/23   RSV Due None found     Hepatitis C: Up to date 11/29/14 Non-Reactive   Lipids:  No results found for: CHOL, TRIG, HDL, LDL, VLDL, CHOLHDLRATIO 02/22/23 Ryno-795 high, Trig 235 high, HDL 47, LDL 110.0, Non HDL 157     Diabetes: Due Pt non diabetic, needs A1C  Lab Results   Component Value Date    NA 139 02/24/2023    K 3.6 02/24/2023    CL 103 02/24/2023    CO2 28 02/24/2023    BUN 16 02/24/2023    CREATININE 0.89 02/24/2023    GLUCOSE 118 (A) 02/24/2023    CALCIUM 9.4 02/24/2023    BILITOT 0.9 02/24/2023    ALKPHOS 53 02/24/2023    AST 19 02/24/2023    ALT 22  02/24/2023    LABGLOM >60 02/24/2023      Fasting BS 02/09/22@7 :55 Glu-103 high, 01/13/21@7 :45 Glu-101 high   A1C No results found for: LABA1C, HBA1C, HGBA1CEXT, HBA1CPOC   DM eye exam No diabetic eye exam on file   DM foot exam There are no preventive care reminders to display for this patient.   Microalb No components found for: LABMICR, MALB24HUR       Active orders for HM gaps:None  HIN reviewed: Yes

## 2024-03-20 NOTE — Progress Notes (Signed)
 [facility]   [address]  [place] [address]  [phone]    SUBSEQUENT MEDICARE Vevay VISIT       CHIEF COMPLAINT     Katherine Osborne, 73 y.o. female , presents for AWV    HPI      Preventive  Due for 5 year recall colonoscopy  Sees GYN, Mam in March normal  Hx of osteopenia on Vit D, recent Vit D check WNL  Sees Derm for hx of melanoma  Recent lipids with fair control  BP controlled today and recent lytes/GFR/Cr WNL  Brought A1c down from 7 to 5.6 by giving up candy  Foot arthritis still bothersome    ROS: No TIA's or dysphagia. No prolonged cough. No dyspnea or chest pain on exertion.  No abdominal pain, change in bowel habits, black or bloody stools.  No urinary tract symptoms. She is post menopausal. No hot flashes, abnormal vaginal bleeding, discharge or unexpected pelvic pain. No new breast lumps, breast pain or nipple discharge.      OBJECTIVE   Visit Vitals  Vitals:    03/20/24 0917   BP: 128/64   Pulse: 74   SpO2: 96%        BP Readings from Last 3 Encounters:   03/20/24 128/64   03/24/23 130/76   03/09/23 128/74        Wt Readings from Last 3 Encounters:   03/20/24 81 kg (178 lb 9.6 oz)   03/24/23 82.1 kg (181 lb 1.6 oz)   03/09/23 83.2 kg (183 lb 6.4 oz)    BMI was elevated today, and weight loss plan recommended is : conventional weight loss and daily exercise regimen.      Physical Exam  Constitutional:       Appearance: Normal appearance.   HENT:      Head: Normocephalic and atraumatic.      Ears:      Comments: Bilateral mild serous effusions  Eyes:      General:         Right eye: No discharge.         Left eye: No discharge.      Extraocular Movements: Extraocular movements intact.      Conjunctiva/sclera: Conjunctivae normal.      Pupils: Pupils are equal, round, and reactive to light.   Cardiovascular:      Rate and Rhythm: Normal rate and regular rhythm.      Pulses: Normal pulses.      Heart sounds: Normal heart sounds.   Pulmonary:       Effort: Pulmonary effort is normal.      Breath sounds: Normal breath sounds.   Abdominal:      General: Bowel sounds are normal.      Palpations: Abdomen is soft.   Musculoskeletal:      Cervical back: Normal range of motion and neck supple.      Right lower leg: No edema.      Left lower leg: No edema.   Skin:     General: Skin is warm.      Capillary Refill: Capillary refill takes less than 2 seconds.   Neurological:      General: No focal deficit present.      Mental Status: She is alert.   Psychiatric:         Mood and Affect: Mood normal.         Behavior: Behavior normal.  ASSESSMENT AND PLAN     Diagnoses and all orders for this visit:    Katherine Osborne was seen today for medicare awv.    Diagnoses and all orders for this visit:    Medicare annual wellness visit, subsequent    Screening for colon cancer  -     Referral to [facility]     1.  Preventive  Ordered colonoscopy.  Reviewed labs with pt and will continue current medications.        WELLNESS EVALUATION & HEALTH RISK ASSESSMENT     Patient's complete Health Risk Assessment and screening values have been reviewed and are found in Flowsheets. The following problems were reviewed today and where indicated follow up appointments were made and/or referrals ordered.    Positive Risk Factor Screenings with Interventions:                No Positive Risk Factors identified today.            IMMUNIZATIONS     Immunization History   Administered Date(s) Administered    COVID-19, MODERNA BLUE border, Primary or Immunocompromised, (age 12y+), IM, 100 mcg/0.5mL 09/08/2019, 10/06/2019, 05/24/2020, 04/27/2021, 03/09/2023    Influenza Virus Vaccine 04/28/2019, 05/17/2021    Influenza, FLUAD, (age 24 y+), IM, Quadv, 0.46mL 04/29/2019    Pneumococcal, PCV20, PREVNAR 20, (age 6w+), IM, 0.5mL 02/26/2021    Pneumococcal, PPSV23, PNEUMOVAX 23, (age 2y+), SC/IM, 0.5mL 05/18/2018    TDaP, ADACEL (age 80y-64y), BOOSTRIX (age 10y+), IM, 0.5mL 05/11/2017    Zoster  Recombinant (Shingrix) 05/23/2017, 08/05/2017        INDIVIDUALIZED SCREENING, EDUCATION, AND PLAN     The patient and any caregiver(s) were counseled on: Healthcare maintenance and preventive items as above., Healthy diet, maintaining a healthy weight, and getting at least 30 minutes of exercise most every day.    The patient is not on high risk medication(s) including benzodiaepines.    Based on my evaluation of the patient and the Health Risk Assessment performed today, there is not evidence of cognitive impairment.      Medications, allergies, problem list, previous encounters, recent results, medical, social and family history reviewed in the electronic health record. Medications and problems are viewable in the Encounter tab for this visit.    Current Outpatient Medications   Medication Sig Dispense Refill    Multiple Vitamins-Minerals (PRESERVISION AREDS 2) CAPS Take 2 capsules by mouth daily      Calcium Carbonate-Vit D-Min (CALCIUM 1200 PO) Take 2 tablets by mouth daily      losartan  (COZAAR ) 25 MG tablet Take 1 tablet by mouth daily 90 tablet 3    hydroCHLOROthiazide  (HYDRODIURIL ) 25 MG tablet Take 1 tablet by mouth daily 90 tablet 3    Cholecalciferol 50 MCG (2000 UT) CAPS Take 1 capsule by mouth daily      Red Yeast Rice Extract 600 MG CAPS Take 1,200 mg by mouth daily      triamcinolone (KENALOG) 0.1 % cream Apply 1 oz topically as needed       No current facility-administered medications for this visit.        Medications Discontinued During This Encounter   Medication Reason    fluticasone (FLONASE) 50 MCG/ACT nasal spray LIST CLEANUP    fluticasone (FLONASE) 50 MCG/ACT nasal spray LIST CLEANUP        Allergies   Allergen Reactions    Adhesive Tape Rash     Bandaids    Amoxicillin Rash  Clindamycin Diarrhea       Past Medical History:   Diagnosis Date    Back pain     Bilateral cataracts     Cancer (HCC)     melanoma removed from arm x2 per hx    Contusion of left breast     Diverticulitis      Epiretinal membrane     Bilateral    H/O seasonal allergies     Hyperlipidemia     Hypertension     Hypertensive disorder     Hypertensive retinopathy of both eyes     Hypokalemia     Ill-defined condition     1973 mono with jaundice    Ill-defined condition     May 2022, Covid+ mild case    Kidney stone     Liver disease     fatty liver    Macular drusen, bilateral     Nuclear senile cataract of both eyes     Osteopenia     Palpitations     PUD (peptic ulcer disease)     ? of over 20 yrs ago    Submandibular lymphadenopathy     Vitreous floaters             Procedure Laterality Date    CHOLECYSTECTOMY, LAPAROSCOPIC  01/29/2021    Laparoscopic cholecystectomy - Raynell Kristy Porto MD    COLONOSCOPY  03/07/2019    [facility] - Colonoscopy diverticulosis, polyp Oneil Rhyme     CYST REMOVAL  07/05/1981    pilonidal cyst    HYSTERECTOMY (CERVIX STATUS UNKNOWN)  2002    prolaped uterus fibroid    OVARY REMOVAL Bilateral 2002    prolapsed uterus    WISDOM TOOTH EXTRACTION              Problem Relation Age of Onset    Breast Cancer Mother 27        malignant tumor    Cataracts Mother         Bilateral    Diabetes Mother     Heart Disease Mother     Prostate Cancer Father         malignant    Macular Degen Father     Cataracts Father         Bilateral    Diabetes Father     Heart Disease Father     Colon Cancer Paternal Grandfather     Breast Cancer Maternal Aunt         57's with recurrance    Diabetes Maternal Aunt         Other family hx of    Breast Cancer Maternal Aunt         Social History     Socioeconomic History    Marital status: Married     Spouse name: Not on file    Number of children: Not on file    Years of education: Not on file    Highest education level: Not on file   Occupational History    Not on file   Tobacco Use    Smoking status: Never    Smokeless tobacco: Never   Substance and Sexual Activity    Alcohol use: Yes     Alcohol/week: 0.0 - 1.0 standard drinks of alcohol    Drug use: Never     Sexual activity: Yes     Partners: Male   Other Topics Concern    Not on file   Social  History Narrative    Married  Two Chldren  Retired Runner, 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 Insecurity: Not on file   Transportation Needs: Not on file   Physical Activity: Sufficiently Active (03/20/2024)    Exercise Vital Sign     Days of Exercise per Week: 3 days     Minutes of Exercise per Session: 60 min   Stress: Not on file   Social Connections: Not on file   Intimate Partner Violence: Not on file   Housing Stability: Not on file           Patient Care Team:  Linward Dene PARAS, MD as PCP - General  Kristy Porto, Raynell SQUIBB, MD as Surgeon  Carver-Bialer, Charlott Amble, APRN - NP as Referring Physician  Deborrah Suzen Pulling, MD (Obstetrics & Gynecology)  Rogue Curtistine RAMAN, MD (Ophthalmology)    No follow-up provider specified.  Future Appointments   Date Time Provider Department Center   03/28/2025  9:15 AM Haliey Romberg J, MD [facility]         Arlis Everly J Lainie Daubert, MD, 03/20/24   This encounter has been electronically signed

## 2024-04-18 ENCOUNTER — Encounter

## 2024-04-30 ENCOUNTER — Inpatient Hospital Stay: Payer: Medicare (Managed Care) | Attending: Gastroenterology

## 2024-04-30 MED ORDER — PROPOFOL 200 MG/20ML IV EMUL
200 | INTRAVENOUS | Status: DC | PRN
Start: 2024-04-30 — End: 2024-04-30
  Administered 2024-04-30: 13:00:00 20 via INTRAVENOUS
  Administered 2024-04-30: 13:00:00 80 via INTRAVENOUS
  Administered 2024-04-30: 13:00:00 250 via INTRAVENOUS

## 2024-04-30 MED ORDER — PROPOFOL 200 MG/20ML IV EMUL
200 | INTRAVENOUS | Status: AC
Start: 2024-04-30 — End: 2024-04-30

## 2024-04-30 MED ORDER — SODIUM CHLORIDE 0.9 % IV SOLN
0.9 | INTRAVENOUS | Status: DC | PRN
Start: 2024-04-30 — End: 2024-04-30
  Administered 2024-04-30: 12:00:00 via INTRAVENOUS

## 2024-04-30 MED ORDER — LIDOCAINE HCL 100 MG/5ML IJ SOSY
100 | Freq: Once | INTRAMUSCULAR | Status: DC | PRN
Start: 2024-04-30 — End: 2024-04-30
  Administered 2024-04-30: 13:00:00 100 via INTRAVENOUS

## 2024-04-30 MED FILL — DIPRIVAN 200 MG/20ML IV EMUL: 200 MG/20ML | INTRAVENOUS | Qty: 20 | Fill #0

## 2024-04-30 NOTE — Progress Notes (Signed)
 Patient tolerating PO fluids well. D/C note and instructions reviewed with pt

## 2024-04-30 NOTE — Discharge Instructions (Addendum)
"  You did well during your colonoscopy today.  I removed 2 small polyps from your colon.  I will send your polyps to our pathologist and notify you of the results and when you should have your next colonoscopy.  You also have diverticulosis or small outpouchings from the colon.  I recommend a high-fiber diet.        Date: 04/30/24    Patient: Katherine Osborne,  Age: 73 y.o.,  Sex: female, (DOB:05-Jun-1951)     COLONOSCOPY   DISCHARGE INSTRUCTIONS      The physician performing your procedure today is Dr. Iva [phone]       Important:  As you prepare for discharge, the following information will help you return to your best level of health.    If the symptoms of your disease worsen, please contact your primary Care Provider.                        This information is About Your Procedure        COLONOSCOPY (Examination of the large bowel)  The doctor used an instrument to check the inside of your large intestine (bowel).  This test is done for many reasons. Your doctor has talked to you about the need for this test.  Sometimes polyps (tiny growths) are removed during the test.                            We provide these resources for patients and family members. It is intended to be educational supplement that highlights some of the important points of what we have previously discussed.       Thank you for choosing [facility].   "

## 2024-04-30 NOTE — Discharge Summary (Signed)
"  Patient tolerated procedure well, patient is stable, please see discharge sheet for meds.  Patient meets criteria following surgery for discharge.  "

## 2024-04-30 NOTE — H&P (Signed)
"         Pre-Procedure Colonoscopy Evaluation & History and Physical    Procedure: PR COLORECTAL SCRN; HI RISK IND [G0105] (COLORECTAL CANCER SCREENING, HIGH RISK)    Complete for Surveillance Colonoscopy:    Last colonoscopy > or = 3 years ago   If no, indicate reason for repeat:  []   Last colonoscopy incomplete  []   Last colonoscopy had inadequate prep  []   Last colonoscopy had > 10 adenomas  []   Last colonoscopy had a large flat/sessile adenoma removed piecemeal         Diagnosis / Indication for procedure:     Average risk screening            Past Medical History:   Diagnosis Date    Back pain     Bilateral cataracts     Cancer (HCC)     melanoma removed from arm x2 per hx    Contusion of left breast     Diverticulitis     Epiretinal membrane     Bilateral    H/O seasonal allergies     Hyperlipidemia     Hypertension     Hypertensive disorder     Hypertensive retinopathy of both eyes     Hypokalemia     Ill-defined condition     1973 mono with jaundice    Ill-defined condition     May 2022, Covid+ mild case    Kidney stone     Liver disease     fatty liver    Macular drusen, bilateral     Nuclear senile cataract of both eyes     Osteopenia     Palpitations     PUD (peptic ulcer disease)     ? of over 20 yrs ago    Submandibular lymphadenopathy     Vitreous floaters        Physical Examination:    Mental Status:   [x]   Normal   []   Other:  Lungs:      [x]   Normal   []   Other:  Heart:      [x]   Normal   []   Other:  Abdomen:    [x]   Normal   []   Other        Sedation plan:      Monitor Anesthesia Care      Fairy SHAUNNA Shaggy, MD  04/30/2024  9:01 AM                                "

## 2024-04-30 NOTE — Anesthesia Postprocedure Evaluation (Signed)
"  Department of Anesthesiology  Postprocedure Note    Patient: Katherine Osborne  MRN: [id number]  Birthdate: [birth year]  Date of evaluation: 04/30/2024    Procedure Summary       Date: 04/30/24 Room / Location: [facility] ENDO 01 / [facility]    Anesthesia Start: 0906 Anesthesia Stop: 0943    Procedures:       COLORECTAL CANCER SCREENING, HIGH RISK (Lower GI Region)      COLONOSCOPY POLYPECTOMY SNARE/BIOPSY (Lower GI Region) Diagnosis:       Hx of colonic polyps      Family history of colon cancer      (Hx of colonic polyps [Z86.0100])      (Family history of colon cancer [Z80.0])    Surgeons: Iva Fairy SQUIBB, MD Responsible Provider: Dyana Newness, MD    Anesthesia Type: TIVA ASA Status: 3            Anesthesia Type: TIVA    Aldrete Phase I:      Aldrete Phase II: Aldrete Score: 7    Anesthesia Post Evaluation    Patient location during evaluation: bedside  Patient participation: waiting for patient participation  Level of consciousness: sleepy but conscious  Pain score: 0  Airway patency: patent  Nausea & Vomiting: no nausea  Cardiovascular status: hemodynamically stable  Respiratory status: acceptable  Hydration status: stable  Pain management: adequate      No notable events documented.  "

## 2024-04-30 NOTE — Anesthesia Pre Procedure (Signed)
 "Department of Anesthesiology  Preprocedure Note       Name:  Katherine Osborne   Age:  73 y.o.  DOB:  09/23/1950                                          MRN:  [id number]         Date:  04/30/2024      Surgeon: Clotilde):  Iva Fairy SQUIBB, MD    Procedure: Procedure(s):  COLORECTAL CANCER SCREENING, HIGH RISK    Medications prior to admission:   Prior to Admission medications   Medication Sig Start Date End Date Taking? Authorizing Provider   losartan  (COZAAR ) 25 MG tablet Take 1 tablet by mouth daily 12/19/23  Yes Szylvian, Chadwick J, MD   hydroCHLOROthiazide  (HYDRODIURIL ) 25 MG tablet Take 1 tablet by mouth daily 03/09/23  Yes Szylvian, Chadwick J, MD   Cholecalciferol 50 MCG (2000 UT) CAPS Take 1 capsule by mouth daily   Yes Automatic Reconciliation, Ar   Red Yeast Rice Extract 600 MG CAPS Take 1,200 mg by mouth daily   Yes Automatic Reconciliation, Ar   triamcinolone (KENALOG) 0.1 % cream Apply 1 oz topically as needed   Yes Automatic Reconciliation, Ar   Multiple Vitamins-Minerals (PRESERVISION AREDS 2) CAPS Take 2 capsules by mouth daily  Patient not taking: Reported on 04/30/2024    [provider]   Calcium Carbonate-Vit D-Min (CALCIUM 1200 PO) Take 2 tablets by mouth daily    [provider]       Current medications:    No current facility-administered medications for this encounter.     Facility-Administered Medications Ordered in Other Encounters   Medication Dose Route Frequency Provider Last Rate Last Admin    0.9 % sodium chloride  infusion   IntraVENous Continuous PRN Neill Dayton BROCKS, APRN - CRNA 50 mL/hr at 04/30/24 0821 New Bag at 04/30/24 9178       Allergies:    Allergies   Allergen Reactions    Adhesive Tape Rash     Bandaids    Amoxicillin Rash    Clindamycin Diarrhea       Problem List:    Patient Active Problem List   Diagnosis Code    Medicare annual wellness visit, subsequent Z00.00    Preventative health care Z00.00    Essential hypertension I10    Fatty liver K76.0     Symptomatic cholelithiasis K80.20    Encounter for immunization Z23    Left arm swelling M79.89    Mixed hyperlipidemia E78.2    Class 1 obesity without serious comorbidity with body mass index (BMI) of 30.0 to 30.9 in adult E66.811, Z68.30    Prediabetes R73.03    Derangement of medial meniscus of left knee M23.304    Nondisplaced fracture of proximal phalanx of left lesser toe(s), initial encounter for closed fracture S92.515A       Past Medical History:        Diagnosis Date    Back pain     Bilateral cataracts     Cancer (HCC)     melanoma removed from arm x2 per hx    Contusion of left breast     Diverticulitis     Epiretinal membrane     Bilateral    H/O seasonal allergies     Hyperlipidemia     Hypertension     Hypertensive  disorder     Hypertensive retinopathy of both eyes     Hypokalemia     Ill-defined condition     1973 mono with jaundice    Ill-defined condition     May 2022, Covid+ mild case    Kidney stone     Liver disease     fatty liver    Macular drusen, bilateral     Nuclear senile cataract of both eyes     Osteopenia     Palpitations     PUD (peptic ulcer disease)     ? of over 20 yrs ago    Submandibular lymphadenopathy     Vitreous floaters        Past Surgical History:        Procedure Laterality Date    CHOLECYSTECTOMY, LAPAROSCOPIC  01/29/2021    Laparoscopic cholecystectomy - Raynell Kristy Porto MD    COLONOSCOPY  03/07/2019    [facility] - Colonoscopy diverticulosis, polyp Oneil Rhyme     CYST REMOVAL  07/05/1981    pilonidal cyst    HYSTERECTOMY (CERVIX STATUS UNKNOWN)  2002    prolaped uterus fibroid    OVARY REMOVAL Bilateral 2002    prolapsed uterus    WISDOM TOOTH EXTRACTION         Social History:    Social History     Tobacco Use    Smoking status: Never    Smokeless tobacco: Never   Substance Use Topics    Alcohol use: Not Currently                                Counseling given: Not Answered      Vital Signs (Current):    Vitals:    04/30/24 0824   BP: (!) 156/83   Pulse: 75   Resp: 18   Temp: 97.3 F (36.3 C)   TempSrc: Temporal   SpO2: 96%   Weight: 79.4 kg (175 lb)   Height: 1.651 m (5' 5)                                              BP Readings from Last 3 Encounters:   04/30/24 (!) 156/83   03/20/24 128/64   03/24/23 130/76       NPO Status: Time of last liquid consumption: 0700                        Time of last solid consumption: 1800                        Date of last liquid consumption: 04/30/24                        Date of last solid food consumption: 04/28/24    BMI:   Wt Readings from Last 3 Encounters:   04/30/24 79.4 kg (175 lb)   03/20/24 81 kg (178 lb 9.6 oz)   03/24/23 82.1 kg (181 lb 1.6 oz)     Body mass index is 29.12 kg/m.    CBC: No results found for: WBC, RBC, HGB, HCT, MCV, RDW, PLT    CMP:   Lab Results   Component Value Date/Time    NA 139  02/24/2023 12:00 AM    K 3.6 02/24/2023 12:00 AM    CL 103 02/24/2023 12:00 AM    CO2 28 02/24/2023 12:00 AM    BUN 16 02/24/2023 12:00 AM    CREATININE 0.89 02/24/2023 12:00 AM    LABGLOM >60 02/24/2023 12:00 AM    GLUCOSE 118 02/24/2023 12:00 AM    CALCIUM 9.4 02/24/2023 12:00 AM    BILITOT 0.9 02/24/2023 12:00 AM    ALKPHOS 53 02/24/2023 12:00 AM    AST 19 02/24/2023 12:00 AM    ALT 22 02/24/2023 12:00 AM       POC Tests: No results for input(s): POCGLU, POCNA, POCK, POCCL, POCBUN, POCHEMO, POCHCT in the last 72 hours.    Coags: No results found for: PROTIME, INR, APTT    HCG (If Applicable): No results found for: PREGTESTUR, PREGSERUM, HCG, HCGQUANT     ABGs: No results found for: PHART, PO2ART, PCO2ART, HCO3ART, BEART, O2SATART     Type & Screen (If Applicable):  No results found for: ABORH, LABANTI    Drug/Infectious Status (If Applicable):  No results found for: HIV, HEPCAB    COVID-19 Screening (If Applicable): No results found for: COVID19        Anesthesia  Evaluation          Airway:  Mallampati: II  TM distance: >3 FB   Neck ROM: full    Mouth opening: > = 3 FB   Dental:  normal exam         Pulmonary:   normal exam   breath sounds clear to auscultation           Cardiovascular:     Exercise tolerance: good (>4 METS)    (+)     hypertension:                                            Rhythm: regular  Rate: normal                Neuro/Psych:               GI/Hepatic/Renal:    (+)     PUD  liver disease:  :            Endo/Other:                      Abdominal:  normal exam      Abdomen: soft.      Vascular:         Other Findings:            Anesthesia Plan      MAC     ASA 3     (NPO status, allergies and medication history reviewed.)  Induction: intravenous.  continuous noninvasive hemodynamic monitor  MIPS: Postoperative opioids intended and Prophylactic antiemetics administered.  Anesthetic plan and risks discussed with patient.    Use of blood products discussed with patient whom.    Plan discussed with CRNA.    Attending anesthesiologist reviewed and agrees with Preprocedure content              Delmus Gee, MD   04/30/2024            "

## 2024-04-30 NOTE — Procedures (Signed)
 "  [facility]  Patient: Katherine Osborne, Katherine Osborne  MRN: [id number]  DOB: 25-Nov-1950  Account: [id number]  Sex at Birth: Female  Age: 73 Years  Procedure: Colonoscopy  Date: 04/30/2024  Attending Physician: Fairy Shaggy  Room: [facility] ENDO 01  Additional Staff:       AMMAR  MAHMOUD; DAYTON BROCKS CLINE  Pre-Procedure Diagnosis / Indications:         -  Screening for colorectal malignant neoplasm  Medications:         -  See the Anesthesia note for documentation of the administered            medications  Complications:         -  No immediate complications.  Estimated Blood Loss:         -  Estimated blood loss: None.  Procedure:         - The Pediatric Colonoscope was introduced through the anus and            advanced to the cecum, identified by appendiceal orifice and            ileocecal valve.         -  The colonoscopy was performed without difficulty.         -  The patient tolerated the procedure well.         -  The quality of the bowel preparation was evaluated using the BBPS            [facility] Bowel Preparation Scale) with scores of: Right Colon = 3,            Transverse Colon = 3 and Left Colon = 3 (entire mucosa seen well with            no residual staining, small fragments of stool or opaque liquid).            The total BBPS score equals 9.         -  The ileocecal valve, appendiceal orifice, and rectum were            photographed.  Findings:         -  The perianal and digital rectal examinations were normal.         -  Two sessile polyps were found in the cecum and rectum.  The            polyps were small (4-6 mm) in size.  These polyps were removed with a            cold snare.  Resection and retrieval were complete.  Impression:         -  Two small (4-6 mm) polyps in the cecum and in the rectum, removed            with a cold snare.  Resected and retrieved.  Recommendation:         -  Await pathology results.  Post Procedure Diagnosis:         - 54614, Colonoscopy, flexible; with removal of  tumor(s), polyp(s),            or other lesion(s) by snare technique  Diagnosis Code(s):         - Z12.11, Encounter for screening for malignant neoplasm of colon         - D12.0, Benign neoplasm of cecum         - D12.8, Benign neoplasm of rectum  CPT(R) - 2023 copyright American Medical Association. All Rights Reserved.        The CPT codes, CCI edits and ICD codes generated are intended as        suggestions and were generated based on input data.  These codes are        preliminary and upon coder review may be revised to meet current        compliance and payer requirements.  The provider is responsible for        the final determination of appropriate codes, and modifiers.  Scope In Time:       9:13:02 AM  Scope Out Time:       9:38:37 AM  Scope Withdrawal Time:       00:16:48  Total Procedure Duration:       00:25:35  Fairy Iva Fairy Iva  This document has been electronically signed.  Note Initiated:04/30/2024  Note Completed:04/30/2024 9:49 AM  "

## 2024-05-01 ENCOUNTER — Encounter

## 2024-05-01 NOTE — Telephone Encounter (Signed)
"  Refill Request received from [facility] for Hydrochlorothiazide  25mg  tabs.      "

## 2024-05-02 MED ORDER — HYDROCHLOROTHIAZIDE 25 MG PO TABS
25 | ORAL_TABLET | Freq: Every day | ORAL | 0 refills | 90.00000 days | Status: DC
Start: 2024-05-02 — End: 2024-07-30

## 2024-05-02 NOTE — Telephone Encounter (Signed)
"  Pt due for labs that have been ordered. Mychart message sent to pt    Medications Requested:  Requested Prescriptions     Pending Prescriptions Disp Refills    hydroCHLOROthiazide  (HYDRODIURIL ) 25 MG tablet 90 tablet 3     Sig: Take 1 tablet by mouth daily       Preferred Pharmacy:   [id number] - [place], [place] - [address] - P [phone] - F [phone]  [address]  [place] [address]  Phone: [phone] Fax: [phone]      Date of Last Refill: 03/09/23    Prescription Refill Protocol reviewed:YES    Allergy List Reviewed and Verified: YES    Possible medication to medication interactions reviewed: YES    Last appt @ PCP Office: 03/20/2024     Future Appointments   Date Time Provider Department Center   09/28/2024 12:45 PM [facility] RM 3 SCR [facility]   03/28/2025  9:15 AM Linward Dene PARAS, MD [facility] AMB       MOST RECENT BLOOD PRESSURES  BP Readings from Last 3 Encounters:   04/30/24 115/74   03/20/24 128/64   03/24/23 130/76         MOST RECENT LAB DATA  Lab Results   Component Value Date/Time    CREATEXT 0.85 07/13/2019 12:00 AM    K 3.6 02/24/2023 12:00 AM    POTEXT 3.4 07/13/2019 12:00 AM    ALT 22 02/24/2023 12:00 AM    SGPTALTEXT 25 07/13/2019 12:00 AM    TOTCHOLEXT 221 07/13/2019 12:00 AM     "

## 2024-05-23 ENCOUNTER — Telehealth

## 2024-05-23 NOTE — Telephone Encounter (Signed)
"  Caller and Callback #: patient and Call back #: [phone]  Complaint:   Chief Complaint   Patient presents with    seen at [facility] ED last night for vertigo    Appointment Requested       Patient called stating she was seen at [facility] emergency room yesterday for dizziness.    She was diagnosed with Vertigo, given an RX and told to follow up with her PCP but is not sure of the time frame - maybe one month.    She says today, she feels better, though still dizzy, it is not as bad as it was yesterday. She also reports she woke up with a headache, but took Tylenol to address this.    She was offered an appt with Harlene, but prefers to see Dr Linward.    Please advise.   RED FLAGS - If Yes, transfer call to MA  Chest pain, Chest Burning, Chest Discomfort or pressure not related to coughing [] Y  [x] N   Reports struggling to breathe   [] Y  [x] N   Unable to speak in full sentences due to difficulty breathing [] Y  [x] N   Passed out or fainted today [] Y  [x] N   New numbness, weakness, slurred speech, confusion or vision loss [] Y  [x] N     Facial Drooping [] Y  [x] N             Disposition:  [] Transferred to MA  [] Note routed to MA pool as Urgent  [] Appt today with PCP  [] Appt today with other provider  [] Appt within 7 days with PCP  [] Appt within 7 days with other provider  [] Unable to schedule. Comments required:   [] Patient declined appointment. Comments required:    "

## 2024-05-23 NOTE — Telephone Encounter (Signed)
"  No ED report in HIN. Ok to just schedule your next available to f/u?  "

## 2024-05-24 NOTE — Telephone Encounter (Signed)
"  OK to schedule next available.  Reviewed the ER report and sounds like vertigo.  PT can be helpful for this and we can order this.  "

## 2024-05-25 NOTE — Telephone Encounter (Signed)
"  Patient is calling back. Message relayed. States that she was given the option to start PT first, but would also like to keep the appointment with Dr. Szylvian.    She would like her PT referral sent to:    [facility]  [address]   [place], [place] [address]    [phone]  "

## 2024-05-25 NOTE — Addendum Note (Signed)
"  Addended by: Iverna Hammac M on: 05/25/2024 10:46 AM     Modules accepted: Orders    "

## 2024-05-25 NOTE — Telephone Encounter (Signed)
"  Called pt, she has been scheduled with PCP on 12/15 to discuss referral per pt preference.   "

## 2024-05-25 NOTE — Telephone Encounter (Signed)
 Queued

## 2024-05-28 NOTE — Addendum Note (Signed)
"  Addended by: Orville Mena J on: 05/28/2024 02:39 PM     Modules accepted: Orders    "

## 2024-06-18 ENCOUNTER — Ambulatory Visit
Admit: 2024-06-18 | Discharge: 2024-06-18 | Payer: Medicare (Managed Care) | Attending: Family Medicine | Primary: Family Medicine

## 2024-06-18 NOTE — Patient Instructions (Signed)
"  Do epley manuevers - if PT calls, they can do these with you  For fluid in the ear - try 2 weeks of over the counter zyrtec or claritin AND nasal steroid like Flonase or Nasocort  "

## 2024-06-18 NOTE — Progress Notes (Signed)
 "HISTORY OF PRESENT ILLNESS  Katherine Osborne is a 73 y.o. female who presents for acute visit, c/o vertigo    HPI     Vertigo  Seen at outside ER about 1 month ago for vertiginous symptoms, suggestive of BPPV  Lab w/u was unremarkable  Reports several episodes since but much less severe, happened once last week in bed  Reports some crackling/bubbling sound in R ear  Had placed PT referral for epleys but pt hasn't been called yet  Otherwise feels well      Patient Active Problem List    Diagnosis Date Noted    Vertigo 06/18/2024    Derangement of medial meniscus of left knee 06/23/2023    Nondisplaced fracture of proximal phalanx of left lesser toe(s), initial encounter for closed fracture 05/10/2023    Mixed hyperlipidemia 03/09/2023    Class 1 obesity without serious comorbidity with body mass index (BMI) of 30.0 to 30.9 in adult 03/09/2023    Prediabetes 03/09/2023    Left arm swelling 04/16/2022    Encounter for immunization 02/26/2021    Fatty liver 01/14/2021    Symptomatic cholelithiasis 01/14/2021    Medicare annual wellness visit, subsequent 02/01/2020    Preventative health care 02/01/2020    Essential hypertension 02/01/2020     Current Outpatient Medications   Medication Sig Dispense Refill    meclizine (ANTIVERT) 25 MG tablet Take 1 tablet by mouth 3 times daily as needed for Dizziness      hydroCHLOROthiazide  (HYDRODIURIL ) 25 MG tablet Take 1 tablet by mouth daily 90 tablet 0    Multiple Vitamins-Minerals (PRESERVISION AREDS 2) CAPS Take 2 capsules by mouth daily      Calcium Carbonate-Vit D-Min (CALCIUM 1200 PO) Take 2 tablets by mouth daily      losartan  (COZAAR ) 25 MG tablet Take 1 tablet by mouth daily 90 tablet 3    Cholecalciferol 50 MCG (2000 UT) CAPS Take 1 capsule by mouth daily      Red Yeast Rice Extract 600 MG CAPS Take 1,200 mg by mouth daily      triamcinolone (KENALOG) 0.1 % cream Apply 1 oz topically as needed       No current facility-administered medications for this visit.      Allergies   Allergen Reactions    Adhesive Tape Rash     Bandaids    Amoxicillin Rash    Clindamycin Diarrhea     Past Medical History:   Diagnosis Date    Back pain     Bilateral cataracts     Cancer (HCC)     melanoma removed from arm x2 per hx    Contusion of left breast     Diverticulitis     Epiretinal membrane     Bilateral    H/O seasonal allergies     Hyperlipidemia     Hypertension     Hypertensive disorder     Hypertensive retinopathy of both eyes     Hypokalemia     Ill-defined condition     1973 mono with jaundice    Ill-defined condition     May 2022, Covid+ mild case    Kidney stone     Liver disease     fatty liver    Macular drusen, bilateral     Nuclear senile cataract of both eyes     Osteopenia     Palpitations     PUD (peptic ulcer disease)     ? of over 20 yrs  ago    Submandibular lymphadenopathy     Vitreous floaters      Past Surgical History:   Procedure Laterality Date    CHOLECYSTECTOMY, LAPAROSCOPIC  01/29/2021    Laparoscopic cholecystectomy - Raynell Kristy Porto MD    COLONOSCOPY  03/07/2019    [facility] - Colonoscopy diverticulosis, polyp Oneil Rhyme     COLONOSCOPY N/A 04/30/2024    COLONOSCOPY POLYPECTOMY SNARE/BIOPSY performed by Iva Fairy SQUIBB, MD at [facility] ENDOSCOPY    CYST REMOVAL  07/05/1981    pilonidal cyst    HYSTERECTOMY (CERVIX STATUS UNKNOWN)  2002    prolaped uterus fibroid    OVARY REMOVAL Bilateral 2002    prolapsed uterus    WISDOM TOOTH EXTRACTION       Family History   Problem Relation Age of Onset    Breast Cancer Mother 64        malignant tumor    Cataracts Mother         Bilateral    Diabetes Mother     Heart Disease Mother     Prostate Cancer Father         malignant    Macular Degen Father     Cataracts Father         Bilateral    Diabetes Father     Heart Disease Father     Colon Cancer Paternal Grandfather     Breast Cancer Maternal Aunt         67's with recurrance    Diabetes Maternal Aunt         Other family hx of    Breast Cancer  Maternal Aunt      Social History     Tobacco Use    Smoking status: Never    Smokeless tobacco: Never   Substance Use Topics    Alcohol use: Not Currently        Review of Systems   Constitutional:  Negative for chills and fatigue.   Respiratory:  Negative for cough, shortness of breath and wheezing.    Cardiovascular:  Negative for chest pain, palpitations and leg swelling.   Gastrointestinal:  Negative for nausea and vomiting.   Neurological:  Negative for dizziness, light-headedness and headaches.        Vitals:    06/18/24 1352   BP: 126/66   Pulse: 73   SpO2: 95%     Wt Readings from Last 3 Encounters:   06/18/24 82.5 kg (181 lb 14.4 oz)   04/30/24 79.4 kg (175 lb)   03/20/24 81 kg (178 lb 9.6 oz)       Body mass index is 30.27 kg/m.  BMI was elevated today, and weight loss plan recommended is : conventional weight loss and daily exercise regimen.     Physical Exam  Constitutional:       Appearance: Normal appearance. She is obese.   HENT:      Head: Normocephalic and atraumatic.      Left Ear: Tympanic membrane and ear canal normal.      Ears:      Comments: Mild R serous effusion  Cardiovascular:      Rate and Rhythm: Normal rate and regular rhythm.      Pulses: Normal pulses.      Heart sounds: Normal heart sounds.   Pulmonary:      Effort: Pulmonary effort is normal.      Breath sounds: Normal breath sounds.   Musculoskeletal:  Right lower leg: No edema.      Left lower leg: No edema.   Neurological:      General: No focal deficit present.      Mental Status: She is alert and oriented to person, place, and time.   Psychiatric:         Mood and Affect: Mood normal.          ASSESSMENT and PLAN  Shahed was seen today for follow-up.    Diagnoses and all orders for this visit:    Vertigo     1. Vertigo  New.  Symptoms suggestive of BPPV although may have had some vasovagal components of initial episode, with subsequent episodes consistent with BPPV.  Ok to use meclizine PRN.  Awaiting PT referral.  Gave  home handout for epleys.  For serous effusion, try 2 weeks OTC antihistamine and nasal steroid.  F/u if recurrent or not improving.    "

## 2024-07-08 ENCOUNTER — Encounter

## 2024-07-09 MED ORDER — PREDNISONE 50 MG PO TABS
50 | ORAL_TABLET | Freq: Every day | ORAL | 0 refills | 6.00000 days | Status: AC
Start: 2024-07-09 — End: 2024-07-14

## 2024-07-30 ENCOUNTER — Telehealth

## 2024-07-30 NOTE — Telephone Encounter (Signed)
 Patient's name and date of birth verified at start of call.   Incoming refill request.  Caller has been notified that we require a minimum of 2 business days for refill processing. (This does not include weekends, holidays, etc.)      Katherine Osborne  1950-12-20      Confirmed best contact number:   Home Phone [phone] (home)    Medications Requested:  Requested Prescriptions     Pending Prescriptions Disp Refills    hydroCHLOROthiazide  (HYDRODIURIL ) 25 MG tablet 90 tablet 0     Sig: Take 1 tablet by mouth daily       Preferred Pharmacy:   [id number] - [place], [place] - [address] - [phone] - F [phone]  [address]  [place] [address]  Phone: [phone] Fax: [phone]    Has patient already contacted pharmacy to confirm no refills were on file: Yes    Notes for office regarding medication request:          Other instructions and notes:  Last visit with provider: 06/18/2024  Next scheduled visit with provider: 03/28/2025  *If next visit is not scheduled, book next needed visit or document if recall was added to list prior to sending for processing*  Inform patient that this needs to be on file before sending request as we do require for them to remain up-to-date with their recommended healthcare in order to avoid any interruptions in our ability to provide ongoing care such as refills.     Patient MyChart Status:  For Active Patients - Patient has been notified that they will receive an automated notification via MyChart once their script has been processed.   For Inactive Patients - Patient is aware that we have a patient portal, MyChart, which offers many benefits such as being able to request their refills electronically and receive automated notifications when scripts are processed.  In addition, they can schedule and manage appointments, view their testing results and visit notes, and stay connected with their care team.  Patient offered MyChart today.  Patient  accepted.  (Send link for set up if accepted)

## 2024-08-01 ENCOUNTER — Encounter

## 2024-08-01 MED ORDER — HYDROCHLOROTHIAZIDE 25 MG PO TABS
25 | ORAL_TABLET | Freq: Every day | ORAL | 3 refills | Status: AC
Start: 2024-08-01 — End: ?

## 2024-08-01 NOTE — Telephone Encounter (Signed)
 Medications Requested:  Requested Prescriptions     Pending Prescriptions Disp Refills    hydroCHLOROthiazide  (HYDRODIURIL ) 25 MG tablet 90 tablet 0     Sig: Take 1 tablet by mouth daily       Preferred Pharmacy:   [id number] - [place], [place] - [address] - P [phone] - F [phone]  [address]  [place] [address]  Phone: [phone] Fax: [phone]      Date of Last Refill: 05-02-24    Prescription Refill Protocol reviewed:YES    Allergy List Reviewed and Verified: YES    Possible medication to medication interactions reviewed: YES    Last appt @ PCP Office: 06/18/2024     Future Appointments   Date Time Provider Department Center   09/28/2024 12:45 PM [facility] RM 3 SCR [facility]   03/28/2025  9:15 AM Linward Dene PARAS, MD [facility] AMB       MOST RECENT BLOOD PRESSURES  BP Readings from Last 3 Encounters:   06/18/24 126/66   04/30/24 115/74   03/20/24 128/64         MOST RECENT LAB DATA  Lab Results   Component Value Date/Time    CREATEXT 0.85 07/13/2019 12:00 AM    K 3.6 02/24/2023 12:00 AM    POTEXT 3.4 07/13/2019 12:00 AM    ALT 22 02/24/2023 12:00 AM    SGPTALTEXT 25 07/13/2019 12:00 AM    TOTCHOLEXT 221 07/13/2019 12:00 AM

## 2024-08-01 NOTE — Telephone Encounter (Signed)
 Called pt, she is aware this has been filled. She verbalized understanding and had no further questions.

## 2024-08-01 NOTE — Telephone Encounter (Signed)
 Patient is calling to get an update on refill. States that she submitted on Monday, but went to the pharmacy and it is not there. I did advise that we are working on it.     She would like a call back from [facility] with an update. She only has two pills left.     Please advise.  CB [phone]

## 2024-08-01 NOTE — Telephone Encounter (Addendum)
 Last k Out of range at 3.3 on 03-14-24. Unable to sign prescription.

## 2024-08-03 NOTE — Telephone Encounter (Signed)
 Duplicate     Disp Refills Start End    hydroCHLOROthiazide  (HYDRODIURIL ) 25 MG tablet 90 tablet 3 08/01/2024 --    Sig - Route: Take 1 tablet by mouth daily - Oral    Sent to pharmacy as: hydroCHLOROthiazide  25 MG Oral Tablet (HYDRODIURIL )    E-Prescribing Status: Receipt confirmed by pharmacy (08/01/2024 11:49 AM EST)
